# Patient Record
Sex: Female | Born: 1990 | Race: White | Hispanic: No | Marital: Single | Smoking: Never smoker
Health system: Southern US, Community
[De-identification: ages and names within clinical notes are randomized; demographics above are authoritative.]

## PROBLEM LIST (undated history)

## (undated) DIAGNOSIS — L309 Dermatitis, unspecified: Secondary | ICD-10-CM

## (undated) DIAGNOSIS — L509 Urticaria, unspecified: Secondary | ICD-10-CM

## (undated) DIAGNOSIS — T783XXA Angioneurotic edema, initial encounter: Secondary | ICD-10-CM

## (undated) DIAGNOSIS — J4520 Mild intermittent asthma, uncomplicated: Secondary | ICD-10-CM

## (undated) HISTORY — DX: Dermatitis, unspecified: L30.9

## (undated) HISTORY — DX: Mild intermittent asthma, uncomplicated: J45.20

## (undated) HISTORY — PX: ADENOIDECTOMY: SUR15

## (undated) HISTORY — PX: TYMPANOSTOMY TUBE PLACEMENT: SHX32

## (undated) HISTORY — DX: Urticaria, unspecified: L50.9

## (undated) HISTORY — DX: Angioneurotic edema, initial encounter: T78.3XXA

---
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
## 2015-09-02 ENCOUNTER — Ambulatory Visit (HOSPITAL_COMMUNITY)
Admission: EM | Admit: 2015-09-02 | Discharge: 2015-09-02 | Disposition: A | Discharge status: Home / Self Care | Payer: BLUE CROSS/BLUE SHIELD | Attending: Family Medicine | Admitting: Family Medicine

## 2015-09-02 ENCOUNTER — Encounter (HOSPITAL_COMMUNITY): Payer: Self-pay | Admitting: Family Medicine

## 2015-09-02 DIAGNOSIS — R109 Unspecified abdominal pain: Secondary | ICD-10-CM | POA: Diagnosis not present

## 2015-09-02 DIAGNOSIS — R319 Hematuria, unspecified: Secondary | ICD-10-CM | POA: Diagnosis not present

## 2015-09-02 DIAGNOSIS — Z882 Allergy status to sulfonamides status: Secondary | ICD-10-CM | POA: Diagnosis not present

## 2015-09-02 LAB — POCT PREGNANCY, URINE: PREG TEST UR: NEGATIVE

## 2015-09-02 LAB — POCT URINALYSIS DIP (DEVICE)
Bilirubin Urine: NEGATIVE
Glucose, UA: NEGATIVE mg/dL
KETONES UR: NEGATIVE mg/dL
Nitrite: NEGATIVE
PH: 6.5 (ref 5.0–8.0)
PROTEIN: NEGATIVE mg/dL
Specific Gravity, Urine: 1.02 (ref 1.005–1.030)
UROBILINOGEN UA: 0.2 mg/dL (ref 0.0–1.0)

## 2015-09-02 MED ORDER — CIPROFLOXACIN HCL 500 MG PO TABS: 500.0000 mg | ORAL_TABLET | Freq: Two times a day (BID) | ORAL | 0 refills | Status: DC

## 2015-09-02 NOTE — ED Triage Notes (Signed)
Pt here for hematuria that started today. sts some pain with urination.

## 2015-09-02 NOTE — ED Provider Notes (Signed)
[facility]    CSN: [id number] Arrival date & time: 09/02/15  1950  First Provider Contact:  First MD Initiated Contact with Patient 09/02/15 2008        History   Chief Complaint Chief Complaint  Patient presents with  . Hematuria    HPI Joy NoelKatherine Wade is a 25 y.o. female.   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 She's had kidney stones 2 over the last 8 years. She is not a nausea or vomiting.  She describes intermittent blood on tissue when she wipes after urinating. Her period is due now. She has some lower abdominal cramping consistent with her menses..  Patient is quite anxious stating that she just had a panic attack because she has had some any losses in her family from various causes.      History reviewed. No pertinent past medical history.  There are no active problems to display for this patient.   History reviewed. No pertinent surgical history.  OB History    No data available       Home Medications    Prior to Admission medications   Medication Sig Start Date End Date Taking? Authorizing Provider  ciprofloxacin (CIPRO) 500 MG tablet Take 1 tablet (500 mg total) by mouth 2 (two) times daily. 09/02/15   Elvina SidleKurt Lona Six, MD    Family History History reviewed. No pertinent family history.  Social History Social History  Substance Use Topics  . Smoking status: Never Smoker  . Smokeless tobacco: Never Used  . Alcohol use Not on file     Allergies   Other and Sulfa antibiotics   Review of Systems Review of Systems  Constitutional: Negative.   HENT: Negative.   Eyes: Negative.   Respiratory: Negative.   Cardiovascular: Negative.   Gastrointestinal: Negative.   Genitourinary: Positive for flank pain and hematuria. Negative for vaginal discharge and vaginal pain.  Neurological: Negative.   Hematological: Negative.      Physical Exam Triage Vital  Signs ED Triage Vitals  Enc Vitals Group     BP      Pulse      Resp      Temp      Temp src      SpO2      Weight      Height      Head Circumference      Peak Flow      Pain Score      Pain Loc      Pain Edu?      Excl. in GC?    No data found.   Updated Vital Signs BP 142/91   Pulse 115   Temp 99.1 F (37.3 C)   Resp 18   LMP 08/10/2015   SpO2 98%     Physical Exam  Constitutional: She is oriented to person, place, and time. She appears well-developed and well-nourished.  HENT:  Head: Normocephalic and atraumatic.  Eyes: Conjunctivae are normal. Pupils are equal, round, and reactive to light.  Neck: Neck supple.  Abdominal: Soft. Bowel sounds are normal.  Minimal left flank tenderness  Musculoskeletal: Normal range of motion.  Neurological: She is alert and oriented to person, place, and time.  Skin: Skin is warm and dry.  Psychiatric:  Patient is anxious but appropriate  Nursing note and vitals reviewed.    UC Treatments / Results  Labs (all labs ordered are listed,  but only abnormal results are displayed) Labs Reviewed  POCT URINALYSIS DIP (DEVICE) - Abnormal; Notable for the following:       Result Value   Hgb urine dipstick MODERATE (*)    Leukocytes, UA TRACE (*)    All other components within normal limits  URINE CULTURE  POCT PREGNANCY, URINE    EKG  EKG Interpretation None       Radiology No results found.  Procedures Procedures (including critical care time)  Medications Ordered in UC Medications - No data to display   Initial Impression / Assessment and Plan / UC Course  I have reviewed the triage vital signs and the nursing notes.  Pertinent labs & imaging results that were available during my care of the patient were reviewed by me and considered in my medical decision making (see chart for details).  Clinical Course    Final Clinical Impressions(s) / UC Diagnoses   Final diagnoses:  Hematuria  Left flank pain     New Prescriptions New Prescriptions   CIPROFLOXACIN (CIPRO) 500 MG TABLET    Take 1 tablet (500 mg total) by mouth 2 (two) times daily.     Elvina SidleKurt Analayah Brooke, MD 09/02/15 2047

## 2015-09-02 NOTE — Discharge Instructions (Signed)
Urine tests is suggestive of urinary tract infection. You may still have a kidney stone in conjunction with this infection. Therefore we are starting you on antibiotics and ask you to increase your consumption of water.  If your symptoms worsen or do not resolve over the next 48 hours, please return or go to the emergency room

## 2015-09-04 LAB — URINE CULTURE

## 2015-09-10 ENCOUNTER — Telehealth (HOSPITAL_COMMUNITY): Payer: Self-pay | Admitting: Emergency Medicine

## 2015-09-10 NOTE — Telephone Encounter (Signed)
-----   Message from Joy MooreLaura W Murray, MD sent at 09/05/2015  9:52 AM EDT ----- Please let patient know that urine culture does not clearly demonstrate a UTI.  Recheck for further evaluation if symptoms persist.  LM

## 2015-09-10 NOTE — Telephone Encounter (Signed)
Called pt and notified of recent lab results from visit 8/17 Pt ID'd properly... Reports feeling better and sx have subsided and has finished antibiotics.  Adv pt if sx are not getting better to return or to f/u w/PCP Pt verb understanding.

## 2016-11-16 ENCOUNTER — Ambulatory Visit (INDEPENDENT_AMBULATORY_CARE_PROVIDER_SITE_OTHER): Payer: BLUE CROSS/BLUE SHIELD | Admitting: Allergy and Immunology

## 2016-11-16 ENCOUNTER — Encounter: Payer: Self-pay | Admitting: Allergy and Immunology

## 2016-11-16 VITALS — BP 122/90 | HR 92 | Temp 98.1°F | Resp 16 | Ht 62.32 in | Wt 178.8 lb

## 2016-11-16 DIAGNOSIS — J4520 Mild intermittent asthma, uncomplicated: Secondary | ICD-10-CM | POA: Insufficient documentation

## 2016-11-16 DIAGNOSIS — H1013 Acute atopic conjunctivitis, bilateral: Secondary | ICD-10-CM

## 2016-11-16 DIAGNOSIS — T781XXA Other adverse food reactions, not elsewhere classified, initial encounter: Secondary | ICD-10-CM | POA: Diagnosis not present

## 2016-11-16 DIAGNOSIS — T7800XA Anaphylactic reaction due to unspecified food, initial encounter: Secondary | ICD-10-CM | POA: Insufficient documentation

## 2016-11-16 DIAGNOSIS — H1010 Acute atopic conjunctivitis, unspecified eye: Secondary | ICD-10-CM | POA: Insufficient documentation

## 2016-11-16 DIAGNOSIS — J3089 Other allergic rhinitis: Secondary | ICD-10-CM | POA: Diagnosis not present

## 2016-11-16 HISTORY — DX: Mild intermittent asthma, uncomplicated: J45.20

## 2016-11-16 MED ORDER — AZELASTINE-FLUTICASONE 137-50 MCG/ACT NA SUSP: 1.0000 | Freq: Two times a day (BID) | NASAL | 5 refills | Status: DC

## 2016-11-16 MED ORDER — ALBUTEROL SULFATE HFA 108 (90 BASE) MCG/ACT IN AERS: 2.0000 | INHALATION_SPRAY | Freq: Four times a day (QID) | RESPIRATORY_TRACT | 2 refills | Status: DC | PRN

## 2016-11-16 MED ORDER — EPINEPHRINE 0.3 MG/0.3ML IJ SOAJ: INTRAMUSCULAR | 3 refills | Status: DC

## 2016-11-16 MED ORDER — LEVOCETIRIZINE DIHYDROCHLORIDE 5 MG PO TABS: 5.0000 mg | ORAL_TABLET | Freq: Every day | ORAL | 5 refills | Status: DC | PRN

## 2016-11-16 MED ORDER — OLOPATADINE HCL 0.7 % OP SOLN: 1.0000 [drp] | Freq: Every day | OPHTHALMIC | 5 refills | Status: DC | PRN

## 2016-11-16 NOTE — Assessment & Plan Note (Signed)
   Continue to have access to albuterol HFA, 1-2 inhalations every 4-6 hours if needed.  A refill prescription has been provided.  Subjective and objective measures of pulmonary function will be followed and the treatment plan will be adjusted accordingly.

## 2016-11-16 NOTE — Assessment & Plan Note (Signed)
   Treatment plan as outlined above for allergic rhinitis.  A prescription has been provided for Pazeo, one drop per eye daily as needed.  I have also recommended eye lubricant drops (i.e., Natural Tears) as needed. 

## 2016-11-16 NOTE — Patient Instructions (Addendum)
Food allergy The patient's history suggests food allergy and positive skin test results today supports this diagnosis.  Her history also suggests oral allergy syndrome.  Meticulous avoidance of celery, carrot, and cabbage as discussed.  In addition, she will avoid any fresh/raw fruits or vegetables which cause oropharyngeal pruritus or ear canal pruritus.  A prescription has been provided for epinephrine auto-injector 2 pack along with instructions for proper administration.  A food allergy action plan has been provided and discussed.  Medic Alert identification is recommended.  Information regarding oral allergy syndrome has been discussed and provided in written form.  Perennial and seasonal allergic rhinitis  Aeroallergen avoidance measures have been discussed and provided in written form.  A prescription has been provided for levocetirizine, 5 mg daily as needed.  A prescription has been provided for Dymista (azelastine/fluticasone) nasal spray, 1 spray per nostril twice daily as needed. Proper nasal spray technique has been discussed and demonstrated.  I have also recommended nasal saline spray (i.e., Simply Saline) or nasal saline lavage (i.e., NeilMed) as needed and prior to medicated nasal sprays.  The risks and benefits of aeroallergen immunotherapy have been discussed. The patient is motivated to initiate immunotherapy if insurance coverage is favorable. She will let us know how he/she would like to proceed.  Allergic conjunctivitis  Treatment plan as outlined above for allergic rhinitis.  A prescription has been provided for Pazeo, one drop per eye daily as needed.  I have also recommended eye lubricant drops (i.e., Natural Tears) as needed.  Mild intermittent asthma  Continue to have access to albuterol HFA, 1-2 inhalations every 4-6 hours if needed.  A refill prescription has been provided.  Subjective and objective measures of pulmonary function will be followed and  the treatment plan will be adjusted accordingly.   Return in about 3 months (around 02/16/2017), or if symptoms worsen or fail to improve.  Reducing Pollen Exposure  The American Academy of Allergy, Asthma and Immunology suggests the following steps to reduce your exposure to pollen during allergy seasons.    1. Do not hang sheets or clothing out to dry; pollen may collect on these items. 2. Do not mow lawns or spend time around freshly cut grass; mowing stirs up pollen. 3. Keep windows closed at night.  Keep car windows closed while driving. 4. Minimize morning activities outdoors, a time when pollen counts are usually at their highest. 5. Stay indoors as much as possible when pollen counts or humidity is high and on windy days when pollen tends to remain in the air longer. 6. Use air conditioning when possible.  Many air conditioners have filters that trap the pollen spores. 7. Use a HEPA room air filter to remove pollen form the indoor air you breathe.   Control of House Dust Mite Allergen  House dust mites play a major role in allergic asthma and rhinitis.  They occur in environments with high humidity wherever human skin, the food for dust mites is found. High levels have been detected in dust obtained from mattresses, pillows, carpets, upholstered furniture, bed covers, clothes and soft toys.  The principal allergen of the house dust mite is found in its feces.  A gram of dust may contain 1,000 mites and 250,000 fecal particles.  Mite antigen is easily measured in the air during house cleaning activities.    1. Encase mattresses, including the box spring, and pillow, in an air tight cover.  Seal the zipper end of the encased mattresses with wide adhesive  tape. 2. Wash the bedding in water of 130 degrees Farenheit weekly.  Avoid cotton comforters/quilts and flannel bedding: the most ideal bed covering is the dacron comforter. 3. Remove all upholstered furniture from the  bedroom. 4. Remove carpets, carpet padding, rugs, and non-washable window drapes from the bedroom.  Wash drapes weekly or use plastic window coverings. 5. Remove all non-washable stuffed toys from the bedroom.  Wash stuffed toys weekly. 6. Have the room cleaned frequently with a vacuum cleaner and a damp dust-mop.  The patient should not be in a room which is being cleaned and should wait 1 hour after cleaning before going into the room. 7. Close and seal all heating outlets in the bedroom.  Otherwise, the room will become filled with dust-laden air.  An electric heater can be used to heat the room. Reduce indoor humidity to less than 50%.  Do not use a humidifier.  Control of Dog or Cat Allergen  Avoidance is the best way to manage a dog or cat allergy. If you have a dog or cat and are allergic to dog or cats, consider removing the dog or cat from the home. If you have a dog or cat but don't want to find it a new home, or if your family wants a pet even though someone in the household is allergic, here are some strategies that may help keep symptoms at bay:  1. Keep the pet out of your bedroom and restrict it to only a few rooms. Be advised that keeping the dog or cat in only one room will not limit the allergens to that room. 2. Don't pet, hug or kiss the dog or cat; if you do, wash your hands with soap and water. 3. High-efficiency particulate air (HEPA) cleaners run continuously in a bedroom or living room can reduce allergen levels over time. 4. Place electrostatic material sheet in the air inlet vent in the bedroom. 5. Regular use of a high-efficiency vacuum cleaner or a central vacuum can reduce allergen levels. 6. Giving your dog or cat a bath at least once a week can reduce airborne allergen.  Control of Mold Allergen  Mold and fungi can grow on a variety of surfaces provided certain temperature and moisture conditions exist.  Outdoor molds grow on plants, decaying vegetation and soil.   The major outdoor mold, Alternaria and Cladosporium, are found in very high numbers during hot and dry conditions.  Generally, a late Summer - Fall peak is seen for common outdoor fungal spores.  Rain will temporarily lower outdoor mold spore count, but counts rise rapidly when the rainy period ends.  The most important indoor molds are Aspergillus and Penicillium.  Dark, humid and poorly ventilated basements are ideal sites for mold growth.  The next most common sites of mold growth are the bathroom and the kitchen.  Outdoor MicrosoftMold Control 1. Use air conditioning and keep windows closed 2. Avoid exposure to decaying vegetation. 3. Avoid leaf raking. 4. Avoid grain handling. 5. Consider wearing a face mask if working in moldy areas.  Indoor Mold Control 1. Maintain humidity below 50%. 2. Clean washable surfaces with 5% bleach solution. 3. Remove sources e.g. Contaminated carpets.  Control of Cockroach Allergen  Cockroach allergen has been identified as an important cause of acute attacks of asthma, especially in urban settings.  There are fifty-five species of cockroach that exist in the [place], however only three, the [place], [place] and Oriental species produce allergen that can affect patients  with Asthma.  Allergens can be obtained from fecal particles, egg casings and secretions from cockroaches.    1. Remove food sources. 2. Reduce access to water. 3. Seal access and entry points. 4. Spray runways with 0.5-1% Diazinon or Chlorpyrifos 5. Blow boric acid power under stoves and refrigerator. 6. Place bait stations (hydramethylnon) at feeding sites.   Oral Allergy Syndrome (OAS)  Oral Allergy Syndrome or OAS is an allergic reaction to certain (usually fresh) fruits, nuts, and vegetables. The allergy is not actually an allergy to food but a syndrome that develops in pollen allergy sufferers. The immune system mistakes the food proteins for the pollen proteins and causes an allergic  reaction. For instance, an allergy to ragweed is associated with OAS reactions to banana, watermelon, cantaloupe, honeydew, zucchini, and cucumber. This does not mean that all sufferers of an allergy to ragweed will experience adverse effects from all or even any of these foods. Reactions may begin with one type of food and with reactions to others developing later. However, reaction to one or more foods in any given category does not necessarily mean a person is allergic to all foods in that group. OAS sufferers may have a number of reactions that usually occur very rapidly, within minutes of eating a trigger food. The most common reaction is an itching or burning sensation in the lips, mouth, and/or pharynx. Sometimes other reactions can be triggered in the eyes, nose, and skin. The most severe reactions can result in asthma problems or anaphylaxis.  If a sufferer is able to swallow the food, there is a good chance that there will be a reaction later in the gastrointestinal tract. Vomiting, diarrhea, severe indigestion, or cramps may occur.  Treatment: An OAS sufferer should avoid foods to which they are allergic. Peeling or cooking the food has shown to reduce symptoms in the throat and mouth, but may not relieve symptoms in the gastrointestinal tract. Antihistamines may also relieve the symptoms of the allergy. Persons with severe reactions may consider carrying injectable epinephrine should systemic symptoms occur. Allergy immunotherapy to the pollens has improved or cured OAS in many patients, though this has not been consistent for all patients. Laban Emperor pollen: almonds, apples, celery, cherries, hazel nuts, peaches, pears, parsley, strawberry, raspberry . Birch pollen: almonds, apples, apricots, avocados, bananas, carrots, celery, cherries, chicory, coriander, fennel, fig, hazel nuts, kiwifruit, nectarines, parsley, parsnips, peaches, pears, peppers, plums, potatoes, prunes, soy, strawberries, wheat;  Potential: walnuts . Grass pollen: fig, melons, tomatoes, oranges . Mugwort pollen : carrots, celery, coriander, fennel, parsley, peppers, sunflower . Ragweed pollen : banana, cantaloupe, cucumber, green pepper, paprika, sunflower seeds/oil, honeydew, watermelon, zucchini, echinacea, artichoke, dandelions, honey (if bees pollinate from wild flowers), hibiscus or chamomile tea . Possible cross-reactions (to any of the above): berries (strawberries, blueberries, raspberries, etc), citrus (oranges, lemons, etc), grapes, mango, figs, peanut, pineapple, pomegranates, watermelon

## 2016-11-16 NOTE — Assessment & Plan Note (Signed)
   Aeroallergen avoidance measures have been discussed and provided in written form.  A prescription has been provided for levocetirizine, 5 mg daily as needed.  A prescription has been provided for Dymista (azelastine/fluticasone) nasal spray, 1 spray per nostril twice daily as needed. Proper nasal spray technique has been discussed and demonstrated.  I have also recommended nasal saline spray (i.e., Simply Saline) or nasal saline lavage (i.e., NeilMed) as needed and prior to medicated nasal sprays.  The risks and benefits of aeroallergen immunotherapy have been discussed. The patient is motivated to initiate immunotherapy if insurance coverage is favorable. She will let us know how he/she would like to proceed.

## 2016-11-16 NOTE — Assessment & Plan Note (Signed)
The patient's history suggests food allergy and positive skin test results today supports this diagnosis.  Her history also suggests oral allergy syndrome.  Meticulous avoidance of celery, carrot, and cabbage as discussed.  In addition, she will avoid any fresh/raw fruits or vegetables which cause oropharyngeal pruritus or ear canal pruritus.  A prescription has been provided for epinephrine auto-injector 2 pack along with instructions for proper administration.  A food allergy action plan has been provided and discussed.  Medic Alert identification is recommended.  Information regarding oral allergy syndrome has been discussed and provided in written form.

## 2016-11-16 NOTE — Progress Notes (Signed)
New Patient Note  RE: Joy Wade MRN: [id number] DOB: 06/20/1990 Date of Office Visit: 11/16/2016  Referring provider: No ref. provider found Primary care provider: Patient, No Pcp Per  Chief Complaint: Allergic Rhinitis ; Bronchitis; and Food Intolerance   History of present illness: Joy NoelKatherine Cragg is a 26 y.o. female presenting today for evaluation of possible food allergies as well as rhinitis.  She reports that since 2014 she has experienced oral pruritus, pharyngeal pruritus, and ear canal pruritus with the consumption of certain raw, fresh fruits and vegetables, including strawberry, weekly, peaches, plums, and carrots.  She is able to tolerate these foods if they have been cooked or processed.  On one occasion, however she consumed vegetable soup, which had been cooked, and aperients tongue swelling, lip swelling, and wheezing. She states that she has "really, really bad pollen and ragweed allergies."  She experiences frequent nasal congestion, rhinorrhea, sneezing, postnasal drainage, nasal pruritus and ocular pruritus, and sinus pressure.  These symptoms occur year round but are more frequent and severe in the spring time in the fall.  Specific triggers include pollen exposure, cats, and raked leaves.  She attempts to control the symptoms with nasal steroid, guaifenesin, and/or DayQuil or NyQuil. Over the past 3 years she has experienced episodes of chest tightness, wheezing, and coughing.  The symptoms occur more frequently in the spring, in the fall, as well as with cold/dry air.  She was prescribed pro-air HFA approximately 2 or 3 years ago and has received rapid symptom relief with this medication.   Assessment and plan: Food allergy The patient's history suggests food allergy and positive skin test results today supports this diagnosis.  Her history also suggests oral allergy syndrome.  Meticulous avoidance of celery, carrot, and cabbage as discussed.  In  addition, she will avoid any fresh/raw fruits or vegetables which cause oropharyngeal pruritus or ear canal pruritus.  A prescription has been provided for epinephrine auto-injector 2 pack along with instructions for proper administration.  A food allergy action plan has been provided and discussed.  Medic Alert identification is recommended.  Information regarding oral allergy syndrome has been discussed and provided in written form.  Perennial and seasonal allergic rhinitis  Aeroallergen avoidance measures have been discussed and provided in written form.  A prescription has been provided for levocetirizine, 5 mg daily as needed.  A prescription has been provided for Dymista (azelastine/fluticasone) nasal spray, 1 spray per nostril twice daily as needed. Proper nasal spray technique has been discussed and demonstrated.  I have also recommended nasal saline spray (i.e., Simply Saline) or nasal saline lavage (i.e., NeilMed) as needed and prior to medicated nasal sprays.  The risks and benefits of aeroallergen immunotherapy have been discussed. The patient is motivated to initiate immunotherapy if insurance coverage is favorable. She will let us know how he/she would like to proceed.  Allergic conjunctivitis  Treatment plan as outlined above for allergic rhinitis.  A prescription has been provided for Pazeo, one drop per eye daily as needed.  I have also recommended eye lubricant drops (i.e., Natural Tears) as needed.  Mild intermittent asthma  Continue to have access to albuterol HFA, 1-2 inhalations every 4-6 hours if needed.  A refill prescription has been provided.  Subjective and objective measures of pulmonary function will be followed and the treatment plan will be adjusted accordingly.   Meds ordered this encounter  Medications  . EPINEPHrine (AUVI-Q) 0.3 mg/0.3 mL IJ SOAJ injection    Sig: Use as  directed for severe allergic reactions    Dispense:  4 Device     Refill:  3  . levocetirizine (XYZAL) 5 MG tablet    Sig: Take 1 tablet (5 mg total) by mouth daily as needed for allergies.    Dispense:  30 tablet    Refill:  5  . Azelastine-Fluticasone (DYMISTA) 137-50 MCG/ACT SUSP    Sig: Place 1 spray into the nose 2 (two) times daily.    Dispense:  1 Bottle    Refill:  5  . Olopatadine HCl (PAZEO) 0.7 % SOLN    Sig: Apply 1 drop to eye daily as needed.    Dispense:  1 Bottle    Refill:  5  . albuterol (PROVENTIL HFA;VENTOLIN HFA) 108 (90 Base) MCG/ACT inhaler    Sig: Inhale 2 puffs into the lungs every 6 (six) hours as needed for wheezing or shortness of breath.    Dispense:  1 Inhaler    Refill:  2    Diagnostics: Spirometry:  Normal with an FEV1 of 113% predicted. This study was performed while the patient was asymptomatic.  Please see scanned spirometry results for details. Epicutaneous testing: Robust positive to grass pollen, weed pollen, ragweed pollen, tree pollen, cat hair, and dust mite antigen.  Positive to mold. Intradermal testing: Positive to molds, dog epithelium, and cockroach antigen. Food allergen skin testing: Positive to celery, carrot, and cabbage.    Physical examination: Blood pressure 122/90, pulse 92, temperature 98.1 F (36.7 C), temperature source Oral, resp. rate 16, height 5' 2.32" (1.583 m), weight 178 lb 12.7 oz (81.1 kg), SpO2 97 %.  General: Alert, interactive, in no acute distress. HEENT: TMs pearly gray, turbinates edematous and pale with clear discharge, post-pharynx moderately erythematous. Neck: Supple without lymphadenopathy. Lungs: Clear to auscultation without wheezing, rhonchi or rales. CV: Normal S1, S2 without murmurs. Abdomen: Nondistended, nontender. Skin: Warm and dry, without lesions or rashes. Extremities:  No clubbing, cyanosis or edema. Neuro:   Grossly intact.  Review of systems:  Review of systems negative except as noted in HPI / PMHx or noted below: Review of Systems    Constitutional: Negative.   HENT: Negative.   Eyes: Negative.   Respiratory: Negative.   Cardiovascular: Negative.   Gastrointestinal: Negative.   Genitourinary: Negative.   Musculoskeletal: Negative.   Skin: Negative.   Neurological: Negative.   Endo/Heme/Allergies: Negative.   Psychiatric/Behavioral: Negative.     Past medical history:  Past Medical History:  Diagnosis Date  . Eczema   . Mild intermittent asthma 11/16/2016  . Urticaria    from medication    Past surgical history:  Past Surgical History:  Procedure Laterality Date  . APPENDECTOMY  2006  . LITHOTRIPSY  2009  . TYMPANOSTOMY TUBE PLACEMENT      Family history: Family History  Problem Relation Age of Onset  . Food Allergy Father   . Bronchitis Father   . Food Allergy Sister     Social history: Social History   Social History  . Marital status: Single    Spouse name: N/A  . Number of children: N/A  . Years of education: N/A   Occupational History  . Not on file.   Social History Main Topics  . Smoking status: Never Smoker  . Smokeless tobacco: Never Used  . Alcohol use Not on file  . Drug use: Unknown  . Sexual activity: Not on file   Other Topics Concern  . Not on file   Social History  Narrative  . No narrative on file   Environmental History: The patient lives in an apartment with carpeting in the bedroom and central air/heat.  She is a non-smoker without pets.  Allergies as of 11/16/2016      Reactions   Amoxicillin    Erythromycin Base Dermatitis   Other    amoxicillan   Sulfa Antibiotics    Sulfamethoxazole Dermatitis      Medication List       Accurate as of 11/16/16  7:01 PM. Always use your most recent med list.          Azelastine-Fluticasone 137-50 MCG/ACT Susp Commonly known as:  DYMISTA Place 1 spray into the nose 2 (two) times daily.   buPROPion 75 MG tablet Commonly known as:  WELLBUTRIN Take by mouth.   ciprofloxacin 500 MG tablet Commonly known  as:  CIPRO Take 1 tablet (500 mg total) by mouth 2 (two) times daily.   diphenhydrAMINE 12.5 MG/5ML elixir Commonly known as:  BENADRYL Take by mouth 4 (four) times daily as needed.   EPINEPHrine 0.3 mg/0.3 mL Soaj injection Commonly known as:  AUVI-Q Use as directed for severe allergic reactions   levocetirizine 5 MG tablet Commonly known as:  XYZAL Take 1 tablet (5 mg total) by mouth daily as needed for allergies.   Olopatadine HCl 0.7 % Soln Commonly known as:  PAZEO Apply 1 drop to eye daily as needed.   PROAIR HFA 108 (90 Base) MCG/ACT inhaler Generic drug:  albuterol Inhale into the lungs every 6 (six) hours as needed for wheezing or shortness of breath.   albuterol 108 (90 Base) MCG/ACT inhaler Commonly known as:  PROVENTIL HFA;VENTOLIN HFA Inhale 2 puffs into the lungs every 6 (six) hours as needed for wheezing or shortness of breath.   sertraline 100 MG tablet Commonly known as:  ZOLOFT TK 1 AND 1/2 TS PO QD       Known medication allergies: Allergies  Allergen Reactions  . Amoxicillin   . Erythromycin Base Dermatitis  . Other     amoxicillan  . Sulfa Antibiotics   . Sulfamethoxazole Dermatitis    I appreciate the opportunity to take part in Cecylia's care. Please do not hesitate to contact me with questions.  Sincerely,   R. Jorene Guest, MD

## 2016-11-20 ENCOUNTER — Encounter: Payer: BLUE CROSS/BLUE SHIELD | Attending: Obstetrics | Admitting: Registered"

## 2016-11-20 ENCOUNTER — Encounter: Payer: Self-pay | Admitting: Registered"

## 2016-11-20 DIAGNOSIS — Z713 Dietary counseling and surveillance: Secondary | ICD-10-CM | POA: Diagnosis not present

## 2016-11-20 DIAGNOSIS — E669 Obesity, unspecified: Secondary | ICD-10-CM | POA: Insufficient documentation

## 2016-11-20 NOTE — Patient Instructions (Signed)
Aim to get 8 hours of sleep each night and keep a regular sleep schedule. Aim to get a balanced breakfast with both carb & protein and aim to have less coke and energy drink. Keep food in your bag for lunch when you don't have time to get a regular meal.

## 2016-11-20 NOTE — Progress Notes (Signed)
Medical Nutrition Therapy:  Appt start time: 0930 end time:  1030.  Assessment:  Primary concerns today: Pt states she has gained about 50 lbs in the last few years. Pt states her dad and sister also noticed they gained weight starting 61~25 yrs old.   Pt states she has OAS to most raw fruits and vegetables and has had extreme reactions to celery and carrots. Pt reports she is going to start immunotherapy next week.  Preferred Learning Style:   No preference indicated   Learning Readiness:   Ready  MEDICATIONS: reviewed   DIETARY INTAKE:  Usual eating pattern includes 2 meals and 2 snacks per day.  Avoided foods include raw fruits and vegetables.  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 Beverages: water, coke, energy drink  Usual physical activity: ADL  Estimated energy needs: 2000 calories 225 g carbohydrates 125 g protein 67 g fat  Progress Towards Goal(s):  In progress.   Nutritional Diagnosis:  NI-5.8.4 Inconsistent carbohydrate intake As related to skipped meals, most carbs in evening.  As evidenced by dietary recall.    Intervention:  Nutrition Education. Discussed balanced eating. Discussed importance of sleep on health.   Teaching Method Utilized:  Hands on  Handouts given during visit include:  My Plate Planner  Sleep Hygiene Tips  Barriers to learning/adherence to lifestyle change: food allergies  Demonstrated degree of understanding via:  Teach Back   Monitoring/Evaluation:  Dietary intake, exercise, and body weight prn.

## 2016-11-23 NOTE — Progress Notes (Signed)
VIALS EXP 11-24-17 

## 2016-11-29 NOTE — Progress Notes (Signed)
Reprint labels 

## 2016-11-30 DIAGNOSIS — J301 Allergic rhinitis due to pollen: Secondary | ICD-10-CM | POA: Diagnosis not present

## 2016-12-01 DIAGNOSIS — J3089 Other allergic rhinitis: Secondary | ICD-10-CM | POA: Diagnosis not present

## 2016-12-11 ENCOUNTER — Ambulatory Visit (INDEPENDENT_AMBULATORY_CARE_PROVIDER_SITE_OTHER): Payer: BLUE CROSS/BLUE SHIELD

## 2016-12-11 DIAGNOSIS — J309 Allergic rhinitis, unspecified: Secondary | ICD-10-CM

## 2016-12-11 NOTE — Progress Notes (Signed)
Immunotherapy   Patient Details  Name: Joy Wade MRN: [id number] Date of Birth: [birth year]  12/11/2016  Joy Wade started injections for  mold,cat,dog,cr,grass,weeds,tree,d-mite Following schedule: A  Frequency:1 time per week Epi-Pen:Epi-Pen Available  Consent signed and patient instructions given.   Riley LamCatina Halynn Reitano 12/11/2016, 10:43 AM

## 2016-12-18 ENCOUNTER — Encounter: Payer: Self-pay | Admitting: *Deleted

## 2016-12-20 ENCOUNTER — Ambulatory Visit (INDEPENDENT_AMBULATORY_CARE_PROVIDER_SITE_OTHER): Payer: BLUE CROSS/BLUE SHIELD

## 2016-12-20 DIAGNOSIS — J309 Allergic rhinitis, unspecified: Secondary | ICD-10-CM | POA: Diagnosis not present

## 2016-12-28 ENCOUNTER — Ambulatory Visit (INDEPENDENT_AMBULATORY_CARE_PROVIDER_SITE_OTHER): Payer: BLUE CROSS/BLUE SHIELD | Admitting: *Deleted

## 2016-12-28 DIAGNOSIS — J309 Allergic rhinitis, unspecified: Secondary | ICD-10-CM

## 2017-01-17 ENCOUNTER — Ambulatory Visit (INDEPENDENT_AMBULATORY_CARE_PROVIDER_SITE_OTHER): Payer: BLUE CROSS/BLUE SHIELD | Admitting: *Deleted

## 2017-01-17 DIAGNOSIS — J309 Allergic rhinitis, unspecified: Secondary | ICD-10-CM | POA: Diagnosis not present

## 2017-01-26 ENCOUNTER — Other Ambulatory Visit: Payer: Self-pay

## 2017-01-26 ENCOUNTER — Ambulatory Visit (INDEPENDENT_AMBULATORY_CARE_PROVIDER_SITE_OTHER): Payer: BLUE CROSS/BLUE SHIELD

## 2017-01-26 DIAGNOSIS — J309 Allergic rhinitis, unspecified: Secondary | ICD-10-CM | POA: Diagnosis not present

## 2017-01-26 MED ORDER — ALBUTEROL SULFATE HFA 108 (90 BASE) MCG/ACT IN AERS: 2.0000 | INHALATION_SPRAY | Freq: Four times a day (QID) | RESPIRATORY_TRACT | 1 refills | Status: DC | PRN

## 2017-01-26 NOTE — Telephone Encounter (Signed)
Patient came in to get her allergy injections and informed us that her Proair was not called into pharmacy during her last visit. It looks like it was sent in [facility] Will resend.

## 2017-02-07 ENCOUNTER — Ambulatory Visit (INDEPENDENT_AMBULATORY_CARE_PROVIDER_SITE_OTHER): Payer: BLUE CROSS/BLUE SHIELD

## 2017-02-07 DIAGNOSIS — J309 Allergic rhinitis, unspecified: Secondary | ICD-10-CM | POA: Diagnosis not present

## 2017-02-20 ENCOUNTER — Ambulatory Visit (INDEPENDENT_AMBULATORY_CARE_PROVIDER_SITE_OTHER): Payer: BLUE CROSS/BLUE SHIELD | Admitting: *Deleted

## 2017-02-20 DIAGNOSIS — J309 Allergic rhinitis, unspecified: Secondary | ICD-10-CM | POA: Diagnosis not present

## 2017-03-02 ENCOUNTER — Ambulatory Visit (INDEPENDENT_AMBULATORY_CARE_PROVIDER_SITE_OTHER): Payer: BLUE CROSS/BLUE SHIELD | Admitting: *Deleted

## 2017-03-02 DIAGNOSIS — J309 Allergic rhinitis, unspecified: Secondary | ICD-10-CM | POA: Diagnosis not present

## 2017-04-27 ENCOUNTER — Ambulatory Visit (INDEPENDENT_AMBULATORY_CARE_PROVIDER_SITE_OTHER): Payer: BLUE CROSS/BLUE SHIELD | Admitting: *Deleted

## 2017-04-27 DIAGNOSIS — J309 Allergic rhinitis, unspecified: Secondary | ICD-10-CM | POA: Diagnosis not present

## 2018-01-25 ENCOUNTER — Telehealth: Payer: Self-pay | Admitting: *Deleted

## 2018-01-25 NOTE — Telephone Encounter (Signed)
Patient called stating that she would like to restart her allergy injections. Her last injection was 04/27/2017 out of her silver vials at .05 dose. Please advise.

## 2018-01-28 NOTE — Telephone Encounter (Signed)
Ok, may restart in Silver vial at [facility]. Schedule A.  Thanks.

## 2018-01-28 NOTE — Telephone Encounter (Signed)
Spoke to patient advised she needs office visit to consult with Dr Nunzio Cobbs before we restart allergy shots. Patient verbalized understanding and call us back to schedule appt.. (consulted with Dr Nunzio Cobbs prior to telling patient this).

## 2018-01-28 NOTE — Telephone Encounter (Signed)
Please advise patient ov will be needed she has not been seen since 11/16/16

## 2018-02-05 NOTE — Telephone Encounter (Signed)
Called patient and left a voicemail asking to return call in order to schedule an OV with Dr. Nunzio Cobbs to restart allergy injections.

## 2018-03-18 ENCOUNTER — Telehealth: Payer: Self-pay | Admitting: *Deleted

## 2018-03-18 ENCOUNTER — Other Ambulatory Visit: Payer: Self-pay | Admitting: *Deleted

## 2018-03-18 ENCOUNTER — Encounter: Payer: Self-pay | Admitting: Allergy and Immunology

## 2018-03-18 ENCOUNTER — Ambulatory Visit (INDEPENDENT_AMBULATORY_CARE_PROVIDER_SITE_OTHER): Payer: 59 | Admitting: Allergy and Immunology

## 2018-03-18 VITALS — BP 130/78 | HR 84 | Temp 98.2°F | Resp 18 | Ht 62.5 in | Wt 167.4 lb

## 2018-03-18 DIAGNOSIS — L309 Dermatitis, unspecified: Secondary | ICD-10-CM | POA: Insufficient documentation

## 2018-03-18 DIAGNOSIS — J3089 Other allergic rhinitis: Secondary | ICD-10-CM

## 2018-03-18 DIAGNOSIS — T7800XD Anaphylactic reaction due to unspecified food, subsequent encounter: Secondary | ICD-10-CM

## 2018-03-18 DIAGNOSIS — H1013 Acute atopic conjunctivitis, bilateral: Secondary | ICD-10-CM

## 2018-03-18 DIAGNOSIS — J4520 Mild intermittent asthma, uncomplicated: Secondary | ICD-10-CM | POA: Diagnosis not present

## 2018-03-18 DIAGNOSIS — T781XXD Other adverse food reactions, not elsewhere classified, subsequent encounter: Secondary | ICD-10-CM

## 2018-03-18 MED ORDER — OLOPATADINE HCL 0.7 % OP SOLN: 1.0000 [drp] | Freq: Every day | OPHTHALMIC | 5 refills | Status: DC | PRN

## 2018-03-18 MED ORDER — LEVOCETIRIZINE DIHYDROCHLORIDE 5 MG PO TABS: 5.0000 mg | ORAL_TABLET | Freq: Every day | ORAL | 5 refills | Status: DC | PRN

## 2018-03-18 MED ORDER — EPINEPHRINE 0.3 MG/0.3ML IJ SOAJ: 0.3000 mg | Freq: Once | INTRAMUSCULAR | 2 refills | Status: AC

## 2018-03-18 MED ORDER — AZELASTINE-FLUTICASONE 137-50 MCG/ACT NA SUSP: 1.0000 | Freq: Two times a day (BID) | NASAL | 5 refills | Status: DC | PRN

## 2018-03-18 NOTE — Telephone Encounter (Signed)
Insurance no longer covers Dymista as well. Please advise if Astelin and Flonase should be sent in instead.

## 2018-03-18 NOTE — Assessment & Plan Note (Signed)
Stable.  Continue to have access to albuterol HFA, 1-2 inhalations every 4-6 hours if needed.    Subjective and objective measures of pulmonary function will be followed and the treatment plan will be adjusted accordingly.

## 2018-03-18 NOTE — Assessment & Plan Note (Signed)
   Continue avoidance of celery, carrot, and cabbage.  In addition, she will avoid any fresh/raw fruits or vegetables which cause oropharyngeal pruritus or ear canal pruritus.  A refill prescription has been provided for epinephrine auto-injector 2 pack along with instructions for proper administration.  Medic Alert identification is recommended.  Information regarding oral allergy syndrome has been discussed and provided in written form.

## 2018-03-18 NOTE — Patient Instructions (Signed)
Perennial and seasonal allergic rhinitis  Aeroallergen avoidance measures have been provided.  A refill prescription has been provided for levocetirizine, 5 mg daily as needed.  A refill prescription has been provided for Dymista (azelastine/fluticasone) nasal spray, 1 spray per nostril twice daily as needed.   Nasal saline spray (i.e., Simply Saline) or nasal saline lavage (i.e., NeilMed) is recommended as needed and prior to medicated nasal sprays.  The risks and benefits of aeroallergen immunotherapy have been discussed. The patient is interested in the possibility of initiating immunotherapy if insurance coverage is favorable. She will let us know how she would like to proceed.  Allergic conjunctivitis  Treatment plan as outlined above for allergic rhinitis.  A refill prescription has been provided for Pazeo, one drop per eye daily as needed.  I have also recommended eye lubricant drops (i.e., Natural Tears) as needed.  Food allergy  Continue avoidance of celery, carrot, and cabbage.  In addition, she will avoid any fresh/raw fruits or vegetables which cause oropharyngeal pruritus or ear canal pruritus.  A refill prescription has been provided for epinephrine auto-injector 2 pack along with instructions for proper administration.  Medic Alert identification is recommended.  Information regarding oral allergy syndrome has been discussed and provided in written form.  Mild intermittent asthma Stable.  Continue to have access to albuterol HFA, 1-2 inhalations every 4-6 hours if needed.    Subjective and objective measures of pulmonary function will be followed and the treatment plan will be adjusted accordingly.  Hand dermatitis Atopic dermatitis versus allergic contact dermatitis.  Samples of been provided for Eucrisa (crisaborole) 2% ointment twice a day to affected areas as needed.  She will start using soap that she brings from home rather than the soap provided by the  daycare.  If this problem persists or progresses we will proceed to patch testing for further evaluation.   Return for Imunotherapy injections. Return for office visit follow-up in 5-6 months or sooner if needed.  Control of Dust Mite Allergen  House dust mites play a major role in allergic asthma and rhinitis.  They occur in environments with high humidity wherever human skin, the food for dust mites is found. High levels have been detected in dust obtained from mattresses, pillows, carpets, upholstered furniture, bed covers, clothes and soft toys.  The principal allergen of the house dust mite is found in its feces.  A gram of dust may contain 1,000 mites and 250,000 fecal particles.  Mite antigen is easily measured in the air during house cleaning activities.    1. Encase mattresses, including the box spring, and pillow, in an air tight cover.  Seal the zipper end of the encased mattresses with wide adhesive tape. 2. Wash the bedding in water of 130 degrees Farenheit weekly.  Avoid cotton comforters/quilts and flannel bedding: the most ideal bed covering is the dacron comforter. 3. Remove all upholstered furniture from the bedroom. 4. Remove carpets, carpet padding, rugs, and non-washable window drapes from the bedroom.  Wash drapes weekly or use plastic window coverings. 5. Remove all non-washable stuffed toys from the bedroom.  Wash stuffed toys weekly. 6. Have the room cleaned frequently with a vacuum cleaner and a damp dust-mop.  The patient should not be in a room which is being cleaned and should wait 1 hour after cleaning before going into the room. 7. Close and seal all heating outlets in the bedroom.  Otherwise, the room will become filled with dust-laden air.  An Lexicographer can  be used to heat the room. Reduce indoor humidity to less than 50%.  Do not use a humidifier.   Reducing Pollen Exposure  The American Academy of Allergy, Asthma and Immunology suggests the following  steps to reduce your exposure to pollen during allergy seasons.    1. Do not hang sheets or clothing out to dry; pollen may collect on these items. 2. Do not mow lawns or spend time around freshly cut grass; mowing stirs up pollen. 3. Keep windows closed at night.  Keep car windows closed while driving. 4. Minimize morning activities outdoors, a time when pollen counts are usually at their highest. 5. Stay indoors as much as possible when pollen counts or humidity is high and on windy days when pollen tends to remain in the air longer. 6. Use air conditioning when possible.  Many air conditioners have filters that trap the pollen spores. 7. Use a HEPA room air filter to remove pollen form the indoor air you breathe.   Control of Dog or Cat Allergen  Avoidance is the best way to manage a dog or cat allergy. If you have a dog or cat and are allergic to dog or cats, consider removing the dog or cat from the home. If you have a dog or cat but don't want to find it a new home, or if your family wants a pet even though someone in the household is allergic, here are some strategies that may help keep symptoms at bay:  1. Keep the pet out of your bedroom and restrict it to only a few rooms. Be advised that keeping the dog or cat in only one room will not limit the allergens to that room. 2. Don't pet, hug or kiss the dog or cat; if you do, wash your hands with soap and water. 3. High-efficiency particulate air (HEPA) cleaners run continuously in a bedroom or living room can reduce allergen levels over time. 4. Place electrostatic material sheet in the air inlet vent in the bedroom. 5. Regular use of a high-efficiency vacuum cleaner or a central vacuum can reduce allergen levels. 6. Giving your dog or cat a bath at least once a week can reduce airborne allergen.   Control of Mold Allergen  Mold and fungi can grow on a variety of surfaces provided certain temperature and moisture conditions exist.   Outdoor molds grow on plants, decaying vegetation and soil.  The major outdoor mold, Alternaria and Cladosporium, are found in very high numbers during hot and dry conditions.  Generally, a late Summer - Fall peak is seen for common outdoor fungal spores.  Rain will temporarily lower outdoor mold spore count, but counts rise rapidly when the rainy period ends.  The most important indoor molds are Aspergillus and Penicillium.  Dark, humid and poorly ventilated basements are ideal sites for mold growth.  The next most common sites of mold growth are the bathroom and the kitchen.  Outdoor [employer] 1. Use air conditioning and keep windows closed 2. Avoid exposure to decaying vegetation. 3. Avoid leaf raking. 4. Avoid grain handling. 5. Consider wearing a face mask if working in moldy areas.  Indoor Mold Control 1. Maintain humidity below 50%. 2. Clean washable surfaces with 5% bleach solution. 3. Remove sources e.g. Contaminated carpets.   Control of Cockroach Allergen  Cockroach allergen has been identified as an important cause of acute attacks of asthma, especially in urban settings.  There are fifty-five species of cockroach that exist in the  [place], however only three, the [place], [place] and [place] species produce allergen that can affect patients with Asthma.  Allergens can be obtained from fecal particles, egg casings and secretions from cockroaches.    1. Remove food sources. 2. Reduce access to water. 3. Seal access and entry points. 4. Spray runways with 0.5-1% Diazinon or Chlorpyrifos 5. Blow boric acid power under stoves and refrigerator. 6. Place bait stations (hydramethylnon) at feeding sites.  Oral Allergy Syndrome (OAS)  Oral Allergy Syndrome or OAS is an allergic reaction to certain (usually fresh) fruits, nuts, and vegetables. The allergy is not actually an allergy to food but a syndrome that develops in pollen allergy sufferers. The immune system mistakes the  food proteins for the pollen proteins and causes an allergic reaction. For instance, an allergy to ragweed is associated with OAS reactions to banana, watermelon, cantaloupe, honeydew, zucchini, and cucumber. This does not mean that all sufferers of an allergy to ragweed will experience adverse effects from all or even any of these foods. Reactions may begin with one type of food and with reactions to others developing later. However, reaction to one or more foods in any given category does not necessarily mean a person is allergic to all foods in that group. OAS sufferers may have a number of reactions that usually occur very rapidly, within minutes of eating a trigger food. The most common reaction is an itching or burning sensation in the lips, mouth, and/or pharynx. Sometimes other reactions can be triggered in the eyes, nose, and skin. The most severe reactions can result in asthma problems or anaphylaxis.  If a sufferer is able to swallow the food, there is a good chance that there will be a reaction later in the gastrointestinal tract. Vomiting, diarrhea, severe indigestion, or cramps may occur.  Treatment: An OAS sufferer should avoid foods to which they are allergic. Peeling or cooking the food has shown to reduce symptoms in the throat and mouth, but may not relieve symptoms in the gastrointestinal tract. Antihistamines may also relieve the symptoms of the allergy. Persons with severe reactions may consider carrying injectable epinephrine should systemic symptoms occur. Allergy immunotherapy to the pollens has improved or cured OAS in many patients, though this has not been consistent for all patients. Laban Emperor pollen: almonds, apples, celery, cherries, hazel nuts, peaches, pears, parsley, strawberry, raspberry . Birch pollen: almonds, apples, apricots, avocados, bananas, carrots, celery, cherries, chicory, coriander, fennel, fig, hazel nuts, kiwifruit, nectarines, parsley, parsnips, peaches, pears,  peppers, plums, potatoes, prunes, soy, strawberries, wheat; Potential: walnuts . Grass pollen: fig, melons, tomatoes, oranges . Mugwort pollen : carrots, celery, coriander, fennel, parsley, peppers, sunflower . Ragweed pollen : banana, cantaloupe, cucumber, green pepper, paprika, sunflower seeds/oil, honeydew, watermelon, zucchini, echinacea, artichoke, dandelions, honey (if bees pollinate from wild flowers), hibiscus or chamomile tea . Possible cross-reactions (to any of the above): berries (strawberries, blueberries, raspberries, etc), citrus (oranges, lemons, etc), grapes, mango, figs, peanut, pineapple, pomegranates, watermelon

## 2018-03-18 NOTE — Assessment & Plan Note (Signed)
   Treatment plan as outlined above for allergic rhinitis.  A refill prescription has been provided for Pazeo, one drop per eye daily as needed.  I have also recommended eye lubricant drops (i.e., Natural Tears) as needed. 

## 2018-03-18 NOTE — Telephone Encounter (Signed)
Patient was seen today to be allergy tested to make vials for allergy injections. Patient needed to get to work and took the injection packet with her. Patient will fill out paperwork and consent and bring paperwork back to the office and then make an appointment to start allergy injections. Emergency Action Plan for food allergies and immunotherapy protocol and has been mailed to the patient's home. Patient is aware.

## 2018-03-18 NOTE — Assessment & Plan Note (Signed)
Atopic dermatitis versus allergic contact dermatitis.  Samples of been provided for Eucrisa (crisaborole) 2% ointment twice a day to affected areas as needed.  She will start using soap that she brings from home rather than the soap provided by the daycare.  If this problem persists or progresses we will proceed to patch testing for further evaluation.

## 2018-03-18 NOTE — Progress Notes (Signed)
Follow-up Note  RE: Joy Wade MRN: [id number] DOB: 1990/09/03 Date of Office Visit: 03/18/2018  Primary care provider: Maudie Flakes, FNP Referring provider: No ref. provider found  History of present illness: Joy Wade is a 28 y.o. female with allergic rhinoconjunctivitis, food allergy, oral allergy syndrome, and intermittent asthma presenting today for follow-up.  She was previously seen in this clinic for her initial evaluation in November 2018.  During that visit skin testing revealed robust reactivity to grass pollen, weed pollen, ragweed pollen, and tree pollen.  In addition she had positive skin test to molds, cat hair, dog epithelia, cockroach antigen, and dust mite antigen.  The patient is interested in the possibility of starting aeroallergen immunotherapy to reduce symptoms and decrease medication requirement.  She has oral allergy syndrome with particular problems with the consumption of strawberries, celery, and pineapple.  She did have what sounds like a systemic reaction with the consumption of celery in the past.  She does her best to avoid problematic foods but needs refill for epinephrine autoinjector. Alyssha also complains of itching, redness, and mild swelling of her hands.  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  She believes that she may have a sensitivity to the soap which is used at the school. Her asthma is well controlled.  She rarely requires albuterol rescue and does not experience limitations in normal daily activities or nocturnal awakenings due to lower respiratory symptoms.  Assessment and plan: Perennial and seasonal allergic rhinitis  Aeroallergen avoidance measures have been provided.  A refill prescription has been provided for levocetirizine, 5 mg daily as needed.  A refill prescription has been provided for Dymista (azelastine/fluticasone) nasal spray, 1 spray per nostril twice  daily as needed.   Nasal saline spray (i.e., Simply Saline) or nasal saline lavage (i.e., NeilMed) is recommended as needed and prior to medicated nasal sprays.  The risks and benefits of aeroallergen immunotherapy have been discussed. The patient is interested in the possibility of initiating immunotherapy if insurance coverage is favorable. She will let us know how she would like to proceed.  Allergic conjunctivitis  Treatment plan as outlined above for allergic rhinitis.  A refill prescription has been provided for Pazeo, one drop per eye daily as needed.  I have also recommended eye lubricant drops (i.e., Natural Tears) as needed.  Food allergy  Continue avoidance of celery, carrot, and cabbage.  In addition, she will avoid any fresh/raw fruits or vegetables which cause oropharyngeal pruritus or ear canal pruritus.  A refill prescription has been provided for epinephrine auto-injector 2 pack along with instructions for proper administration.  Medic Alert identification is recommended.  Information regarding oral allergy syndrome has been discussed and provided in written form.  Mild intermittent asthma Stable.  Continue to have access to albuterol HFA, 1-2 inhalations every 4-6 hours if needed.    Subjective and objective measures of pulmonary function will be followed and the treatment plan will be adjusted accordingly.  Hand dermatitis Atopic dermatitis versus allergic contact dermatitis.  Samples of been provided for Eucrisa (crisaborole) 2% ointment twice a day to affected areas as needed.  She will start using soap that she brings from home rather than the soap provided by the daycare.  If this problem persists or progresses we will proceed to patch testing for further evaluation.   Meds ordered this encounter  Medications  . EPINEPHrine (AUVI-Q) 0.3 mg/0.3 mL IJ SOAJ injection  Sig: Inject 0.3 mLs (0.3 mg total) into the muscle once for 1 dose. As directed for  life-threatening allergic reactions    Dispense:  4 Device    Refill:  2    Please call [phone] for delivery.    Diagnostics: Spirometry:  Normal with an FEV1 of 102% predicted.  Please see scanned spirometry results for details. Environmental skin testing: Positive to epicoccum mold.  Testing in November 2018 revealed reactivity to grass pollen, weed pollen, ragweed pollen, tree pollen, molds, cat hair, dog epithelia, cockroach antigen, and dust mite antigen.  Therefore, these were not retested today.  Food allergen skin test were not retested today.    Physical examination: Blood pressure 130/78, pulse 84, temperature 98.2 F (36.8 C), temperature source Oral, resp. rate 18, height 5' 2.5" (1.588 m), weight 167 lb 6.4 oz (75.9 kg), SpO2 94 %.  General: Alert, interactive, in no acute distress. HEENT: TMs pearly gray, turbinates edematous without discharge, post-pharynx moderately erythematous. Neck: Supple without lymphadenopathy. Lungs: Clear to auscultation without wheezing, rhonchi or rales. CV: Normal S1, S2 without murmurs. Skin: Warm and dry, without lesions or rashes.  The following portions of the patient's history were reviewed and updated as appropriate: allergies, current medications, past family history, past medical history, past social history, past surgical history and problem list.  Allergies as of 03/18/2018      Reactions   Amoxicillin    Erythromycin Base Dermatitis   Other    amoxicillan   Sulfa Antibiotics    Sulfamethoxazole Dermatitis      Medication List       Accurate as of March 18, 2018 12:06 PM. Always use your most recent med list.        albuterol 108 (90 Base) MCG/ACT inhaler Commonly known as:  PROAIR HFA Inhale 2 puffs into the lungs every 6 (six) hours as needed for wheezing or shortness of breath.   Azelastine-Fluticasone 137-50 MCG/ACT Susp Commonly known as:  DYMISTA Place 1 spray into the nose 2 (two) times daily.   clonazePAM  0.5 MG tablet Commonly known as:  KLONOPIN Take 0.5 mg by mouth 2 (two) times daily as needed for anxiety.   diphenhydrAMINE 12.5 MG/5ML elixir Commonly known as:  BENADRYL Take by mouth 4 (four) times daily as needed.   EPINEPHrine 0.3 mg/0.3 mL Soaj injection Commonly known as:  AUVI-Q Use as directed for severe allergic reactions   EPINEPHrine 0.3 mg/0.3 mL Soaj injection Commonly known as:  AUVI-Q Inject 0.3 mLs (0.3 mg total) into the muscle once for 1 dose. As directed for life-threatening allergic reactions   levocetirizine 5 MG tablet Commonly known as:  XYZAL Take 1 tablet (5 mg total) by mouth daily as needed for allergies.   loratadine 10 MG tablet Commonly known as:  CLARITIN Take 10 mg by mouth daily.   Olopatadine HCl 0.7 % Soln Commonly known as:  PAZEO Apply 1 drop to eye daily as needed.   ZOLOFT PO Take 150 mg by mouth daily.       Allergies  Allergen Reactions  . Amoxicillin   . Erythromycin Base Dermatitis  . Other     amoxicillan  . Sulfa Antibiotics   . Sulfamethoxazole Dermatitis   Review of systems: Review of systems negative except as noted in HPI / PMHx or noted below: Constitutional: Negative.  HENT: Negative.   Eyes: Negative.  Respiratory: Negative.   Cardiovascular: Negative.  Gastrointestinal: Negative.  Genitourinary: Negative.  Musculoskeletal: Negative.  Neurological: Negative.  Endo/Heme/Allergies: Negative.  Cutaneous: Negative.  Past Medical History:  Diagnosis Date  . Angio-edema   . Eczema   . Mild intermittent asthma 11/16/2016  . Urticaria    from medication    Family History  Problem Relation Age of Onset  . Food Allergy Father   . Bronchitis Father   . Food Allergy Sister     Social History   Socioeconomic History  . Marital status: Single    Spouse name: Not on file  . Number of children: Not on file  . Years of education: Not on file  . Highest education level: Not on file  Occupational  History  . Not on file  Social Needs  . Financial resource strain: Not on file  . Food insecurity:    Worry: Not on file    Inability: Not on file  . Transportation needs:    Medical: Not on file    Non-medical: Not on file  Tobacco Use  . Smoking status: Never Smoker  . Smokeless tobacco: Never Used  Substance and Sexual Activity  . Alcohol use: Not Currently  . Drug use: Not Currently  . Sexual activity: Not on file  Lifestyle  . Physical activity:    Days per week: Not on file    Minutes per session: Not on file  . Stress: Not on file  Relationships  . Social connections:    Talks on phone: Not on file    Gets together: Not on file    Attends religious service: Not on file    Active member of club or organization: Not on file    Attends meetings of clubs or organizations: Not on file    Relationship status: Not on file  . Intimate partner violence:    Fear of current or ex partner: Not on file    Emotionally abused: Not on file    Physically abused: Not on file    Forced sexual activity: Not on file  Other Topics Concern  . Not on file  Social History Narrative  . Not on file    I appreciate the opportunity to take part in Kerra's care. Please do not hesitate to contact me with questions.  Sincerely,   R. Jorene Guest, MD

## 2018-03-18 NOTE — Telephone Encounter (Signed)
Patient's insurance does not cover Pazeo. Optivar, Zaditor, Patanol, or Lastacraft is preferred. Please advise.

## 2018-03-18 NOTE — Assessment & Plan Note (Signed)
   Aeroallergen avoidance measures have been provided.  A refill prescription has been provided for levocetirizine, 5 mg daily as needed.  A refill prescription has been provided for Dymista (azelastine/fluticasone) nasal spray, 1 spray per nostril twice daily as needed.   Nasal saline spray (i.e., Simply Saline) or nasal saline lavage (i.e., NeilMed) is recommended as needed and prior to medicated nasal sprays.  The risks and benefits of aeroallergen immunotherapy have been discussed. The patient is interested in the possibility of initiating immunotherapy if insurance coverage is favorable. She will let us know how she would like to proceed.

## 2018-03-19 NOTE — Telephone Encounter (Signed)
Yes, may put in for the 2 generics. Thanks.

## 2018-03-20 ENCOUNTER — Telehealth: Payer: Self-pay

## 2018-03-20 ENCOUNTER — Other Ambulatory Visit: Payer: Self-pay

## 2018-03-20 MED ORDER — AZELASTINE HCL 0.1 % NA SOLN: 2.0000 | Freq: Two times a day (BID) | NASAL | 5 refills | Status: DC

## 2018-03-20 MED ORDER — FLUTICASONE PROPIONATE 50 MCG/ACT NA SUSP: 2.0000 | Freq: Two times a day (BID) | NASAL | 5 refills | Status: DC

## 2018-03-20 NOTE — Telephone Encounter (Signed)
Lastacaft, 1 gtt OU QD prn.

## 2018-03-20 NOTE — Telephone Encounter (Signed)
Fax from [facility] pharmacy:  Pazeo not covered:   Alternatives included:  Optivar, zaditor, patanol or lastacraft.  Please select alternative, thank you.

## 2018-03-20 NOTE — Telephone Encounter (Signed)
Sent in generics for fluticasone and azelastine

## 2018-03-21 MED ORDER — LASTACAFT 0.25 % OP SOLN: OPHTHALMIC | 5 refills | Status: DC

## 2018-03-21 NOTE — Telephone Encounter (Signed)
Prescription for Lastacaft sent in to pharmacy.

## 2018-06-27 DIAGNOSIS — R69 Illness, unspecified: Secondary | ICD-10-CM | POA: Diagnosis not present

## 2018-06-28 DIAGNOSIS — Z03818 Encounter for observation for suspected exposure to other biological agents ruled out: Secondary | ICD-10-CM | POA: Diagnosis not present

## 2018-07-03 DIAGNOSIS — R69 Illness, unspecified: Secondary | ICD-10-CM | POA: Diagnosis not present

## 2018-07-09 DIAGNOSIS — R69 Illness, unspecified: Secondary | ICD-10-CM | POA: Diagnosis not present

## 2018-07-11 DIAGNOSIS — R69 Illness, unspecified: Secondary | ICD-10-CM | POA: Diagnosis not present

## 2018-07-12 DIAGNOSIS — Z20828 Contact with and (suspected) exposure to other viral communicable diseases: Secondary | ICD-10-CM | POA: Diagnosis not present

## 2018-07-23 DIAGNOSIS — R69 Illness, unspecified: Secondary | ICD-10-CM | POA: Diagnosis not present

## 2018-07-25 DIAGNOSIS — R69 Illness, unspecified: Secondary | ICD-10-CM | POA: Diagnosis not present

## 2018-08-08 DIAGNOSIS — R69 Illness, unspecified: Secondary | ICD-10-CM | POA: Diagnosis not present

## 2018-08-15 DIAGNOSIS — R69 Illness, unspecified: Secondary | ICD-10-CM | POA: Diagnosis not present

## 2018-09-11 ENCOUNTER — Other Ambulatory Visit: Payer: Self-pay

## 2018-09-11 DIAGNOSIS — Z20822 Contact with and (suspected) exposure to covid-19: Secondary | ICD-10-CM

## 2018-09-12 LAB — NOVEL CORONAVIRUS, NAA: SARS-CoV-2, NAA: NOT DETECTED

## 2018-09-12 LAB — SPECIMEN STATUS REPORT

## 2018-10-23 ENCOUNTER — Other Ambulatory Visit: Payer: Self-pay

## 2018-10-23 DIAGNOSIS — Z20822 Contact with and (suspected) exposure to covid-19: Secondary | ICD-10-CM

## 2018-10-25 LAB — NOVEL CORONAVIRUS, NAA: SARS-CoV-2, NAA: NOT DETECTED

## 2018-11-25 ENCOUNTER — Other Ambulatory Visit: Payer: Self-pay

## 2018-11-25 DIAGNOSIS — Z20822 Contact with and (suspected) exposure to covid-19: Secondary | ICD-10-CM

## 2018-11-27 LAB — NOVEL CORONAVIRUS, NAA: SARS-CoV-2, NAA: NOT DETECTED

## 2018-12-18 ENCOUNTER — Telehealth: Payer: Self-pay

## 2018-12-18 DIAGNOSIS — T7800XA Anaphylactic reaction due to unspecified food, initial encounter: Secondary | ICD-10-CM

## 2018-12-18 MED ORDER — EPINEPHRINE 0.3 MG/0.3ML IJ SOAJ: INTRAMUSCULAR | 0 refills | Status: DC

## 2018-12-18 NOTE — Telephone Encounter (Signed)
Called patient and informed that EpiPen has been sent in. Patient has been scheduled to start allergy injections.

## 2018-12-18 NOTE — Telephone Encounter (Signed)
Yes, she may start immunotherapy injections.  Please use the immunotherapy orders that I wrote on March 18, 2018.  I believe that she wants to receive the injections in the [place] office.  Please make sure that she has set up a first shot appointment. Thank you.

## 2018-12-18 NOTE — Telephone Encounter (Signed)
Patient needs a refill request on the AUV-IQ. Also ready to start immunotherapy. Please call patient

## 2018-12-18 NOTE — Telephone Encounter (Signed)
Auvi-q sent into [facility] pharmacy. Dr. Verlin Fester please advise on Allergy shots.

## 2018-12-18 NOTE — Progress Notes (Addendum)
VIALS EXP 12-19-19 NEEDED MORE LABELS

## 2018-12-19 DIAGNOSIS — J3089 Other allergic rhinitis: Secondary | ICD-10-CM

## 2018-12-20 DIAGNOSIS — J301 Allergic rhinitis due to pollen: Secondary | ICD-10-CM

## 2019-01-03 ENCOUNTER — Ambulatory Visit: Payer: Self-pay

## 2019-01-22 ENCOUNTER — Ambulatory Visit: Payer: Self-pay

## 2019-01-29 ENCOUNTER — Ambulatory Visit (INDEPENDENT_AMBULATORY_CARE_PROVIDER_SITE_OTHER): Payer: BC Managed Care – PPO | Admitting: *Deleted

## 2019-01-29 ENCOUNTER — Other Ambulatory Visit: Payer: Self-pay

## 2019-01-29 DIAGNOSIS — J309 Allergic rhinitis, unspecified: Secondary | ICD-10-CM | POA: Diagnosis not present

## 2019-01-29 NOTE — Progress Notes (Signed)
Immunotherapy   Patient Details  Name: Locklyn Henriquez MRN: [id number] Date of Birth: March 06, 1990  01/29/2019  Rigoberto Noel started injections for  M-C-D-CR, G-WEEDS-T-DM Following schedule: A  Frequency: 1-2X Weekly with 72 hours in between injections Epi-Pen: Yes Consent signed and patient instructions given. Patient started allergy injections today and received .85mL of M-C-D-CR in the RUA and .7mL of G-WEEDS-T-DM in the LUA. Patient waited 30 minutes and did not experience any issues.    Mande Auvil Fernandez-Vernon 01/29/2019, 2:12 PM

## 2019-02-05 ENCOUNTER — Ambulatory Visit (INDEPENDENT_AMBULATORY_CARE_PROVIDER_SITE_OTHER): Payer: BC Managed Care – PPO

## 2019-02-05 DIAGNOSIS — J309 Allergic rhinitis, unspecified: Secondary | ICD-10-CM | POA: Diagnosis not present

## 2019-02-12 ENCOUNTER — Ambulatory Visit (INDEPENDENT_AMBULATORY_CARE_PROVIDER_SITE_OTHER): Payer: BC Managed Care – PPO | Admitting: *Deleted

## 2019-02-12 DIAGNOSIS — J309 Allergic rhinitis, unspecified: Secondary | ICD-10-CM | POA: Diagnosis not present

## 2019-02-19 ENCOUNTER — Ambulatory Visit (INDEPENDENT_AMBULATORY_CARE_PROVIDER_SITE_OTHER): Payer: BC Managed Care – PPO

## 2019-02-19 DIAGNOSIS — J309 Allergic rhinitis, unspecified: Secondary | ICD-10-CM

## 2019-02-26 ENCOUNTER — Ambulatory Visit (INDEPENDENT_AMBULATORY_CARE_PROVIDER_SITE_OTHER): Payer: BC Managed Care – PPO

## 2019-02-26 DIAGNOSIS — J309 Allergic rhinitis, unspecified: Secondary | ICD-10-CM | POA: Diagnosis not present

## 2019-03-11 ENCOUNTER — Ambulatory Visit (INDEPENDENT_AMBULATORY_CARE_PROVIDER_SITE_OTHER): Payer: BC Managed Care – PPO

## 2019-03-11 DIAGNOSIS — J309 Allergic rhinitis, unspecified: Secondary | ICD-10-CM

## 2019-03-21 ENCOUNTER — Ambulatory Visit (INDEPENDENT_AMBULATORY_CARE_PROVIDER_SITE_OTHER): Payer: BC Managed Care – PPO | Admitting: *Deleted

## 2019-03-21 DIAGNOSIS — J309 Allergic rhinitis, unspecified: Secondary | ICD-10-CM | POA: Diagnosis not present

## 2019-03-28 ENCOUNTER — Ambulatory Visit (INDEPENDENT_AMBULATORY_CARE_PROVIDER_SITE_OTHER): Payer: BC Managed Care – PPO

## 2019-03-28 DIAGNOSIS — J309 Allergic rhinitis, unspecified: Secondary | ICD-10-CM

## 2019-04-02 ENCOUNTER — Ambulatory Visit: Payer: Self-pay | Attending: Internal Medicine

## 2019-04-02 DIAGNOSIS — Z20822 Contact with and (suspected) exposure to covid-19: Secondary | ICD-10-CM

## 2019-04-03 LAB — NOVEL CORONAVIRUS, NAA: SARS-CoV-2, NAA: NOT DETECTED

## 2019-04-09 ENCOUNTER — Ambulatory Visit (INDEPENDENT_AMBULATORY_CARE_PROVIDER_SITE_OTHER): Payer: BC Managed Care – PPO

## 2019-04-09 DIAGNOSIS — J309 Allergic rhinitis, unspecified: Secondary | ICD-10-CM

## 2019-04-21 ENCOUNTER — Ambulatory Visit (INDEPENDENT_AMBULATORY_CARE_PROVIDER_SITE_OTHER): Payer: BC Managed Care – PPO

## 2019-04-21 DIAGNOSIS — J309 Allergic rhinitis, unspecified: Secondary | ICD-10-CM

## 2019-04-29 ENCOUNTER — Ambulatory Visit (INDEPENDENT_AMBULATORY_CARE_PROVIDER_SITE_OTHER): Payer: BC Managed Care – PPO

## 2019-04-29 DIAGNOSIS — J309 Allergic rhinitis, unspecified: Secondary | ICD-10-CM

## 2019-05-09 ENCOUNTER — Ambulatory Visit (INDEPENDENT_AMBULATORY_CARE_PROVIDER_SITE_OTHER): Payer: BC Managed Care – PPO

## 2019-05-09 DIAGNOSIS — J309 Allergic rhinitis, unspecified: Secondary | ICD-10-CM | POA: Diagnosis not present

## 2019-05-26 ENCOUNTER — Ambulatory Visit (INDEPENDENT_AMBULATORY_CARE_PROVIDER_SITE_OTHER): Payer: BC Managed Care – PPO

## 2019-05-26 DIAGNOSIS — J309 Allergic rhinitis, unspecified: Secondary | ICD-10-CM | POA: Diagnosis not present

## 2019-06-05 ENCOUNTER — Ambulatory Visit (INDEPENDENT_AMBULATORY_CARE_PROVIDER_SITE_OTHER): Payer: BC Managed Care – PPO

## 2019-06-05 DIAGNOSIS — J309 Allergic rhinitis, unspecified: Secondary | ICD-10-CM

## 2019-06-11 ENCOUNTER — Ambulatory Visit (INDEPENDENT_AMBULATORY_CARE_PROVIDER_SITE_OTHER): Payer: BC Managed Care – PPO

## 2019-06-11 DIAGNOSIS — J309 Allergic rhinitis, unspecified: Secondary | ICD-10-CM

## 2019-06-18 ENCOUNTER — Ambulatory Visit (INDEPENDENT_AMBULATORY_CARE_PROVIDER_SITE_OTHER): Payer: BC Managed Care – PPO

## 2019-06-18 DIAGNOSIS — J309 Allergic rhinitis, unspecified: Secondary | ICD-10-CM | POA: Diagnosis not present

## 2019-06-25 ENCOUNTER — Ambulatory Visit (INDEPENDENT_AMBULATORY_CARE_PROVIDER_SITE_OTHER): Payer: BC Managed Care – PPO

## 2019-06-25 DIAGNOSIS — J309 Allergic rhinitis, unspecified: Secondary | ICD-10-CM | POA: Diagnosis not present

## 2019-07-04 ENCOUNTER — Ambulatory Visit (INDEPENDENT_AMBULATORY_CARE_PROVIDER_SITE_OTHER): Payer: BC Managed Care – PPO

## 2019-07-04 DIAGNOSIS — J309 Allergic rhinitis, unspecified: Secondary | ICD-10-CM | POA: Diagnosis not present

## 2019-07-11 ENCOUNTER — Ambulatory Visit (INDEPENDENT_AMBULATORY_CARE_PROVIDER_SITE_OTHER): Payer: BC Managed Care – PPO

## 2019-07-11 DIAGNOSIS — J309 Allergic rhinitis, unspecified: Secondary | ICD-10-CM | POA: Diagnosis not present

## 2019-07-25 ENCOUNTER — Ambulatory Visit (INDEPENDENT_AMBULATORY_CARE_PROVIDER_SITE_OTHER): Payer: BC Managed Care – PPO

## 2019-07-25 DIAGNOSIS — J309 Allergic rhinitis, unspecified: Secondary | ICD-10-CM | POA: Diagnosis not present

## 2019-08-05 ENCOUNTER — Ambulatory Visit (INDEPENDENT_AMBULATORY_CARE_PROVIDER_SITE_OTHER): Payer: BC Managed Care – PPO

## 2019-08-05 DIAGNOSIS — J309 Allergic rhinitis, unspecified: Secondary | ICD-10-CM

## 2019-08-15 ENCOUNTER — Ambulatory Visit (INDEPENDENT_AMBULATORY_CARE_PROVIDER_SITE_OTHER): Payer: BC Managed Care – PPO

## 2019-08-15 DIAGNOSIS — J309 Allergic rhinitis, unspecified: Secondary | ICD-10-CM

## 2019-08-25 ENCOUNTER — Ambulatory Visit (INDEPENDENT_AMBULATORY_CARE_PROVIDER_SITE_OTHER): Payer: BC Managed Care – PPO

## 2019-08-25 DIAGNOSIS — J309 Allergic rhinitis, unspecified: Secondary | ICD-10-CM | POA: Diagnosis not present

## 2019-09-03 ENCOUNTER — Ambulatory Visit (INDEPENDENT_AMBULATORY_CARE_PROVIDER_SITE_OTHER): Payer: BC Managed Care – PPO

## 2019-09-03 DIAGNOSIS — J309 Allergic rhinitis, unspecified: Secondary | ICD-10-CM | POA: Diagnosis not present

## 2019-09-12 ENCOUNTER — Ambulatory Visit (INDEPENDENT_AMBULATORY_CARE_PROVIDER_SITE_OTHER): Payer: BC Managed Care – PPO | Admitting: *Deleted

## 2019-09-12 DIAGNOSIS — J309 Allergic rhinitis, unspecified: Secondary | ICD-10-CM | POA: Diagnosis not present

## 2019-09-19 ENCOUNTER — Ambulatory Visit (INDEPENDENT_AMBULATORY_CARE_PROVIDER_SITE_OTHER): Payer: BC Managed Care – PPO | Admitting: *Deleted

## 2019-09-19 DIAGNOSIS — J309 Allergic rhinitis, unspecified: Secondary | ICD-10-CM | POA: Diagnosis not present

## 2019-09-24 ENCOUNTER — Emergency Department (HOSPITAL_COMMUNITY): Payer: BC Managed Care – PPO

## 2019-09-24 ENCOUNTER — Encounter (HOSPITAL_COMMUNITY): Payer: Self-pay | Admitting: *Deleted

## 2019-09-24 ENCOUNTER — Emergency Department (HOSPITAL_COMMUNITY)
Admission: EM | Admit: 2019-09-24 | Discharge: 2019-09-24 | Disposition: A | Discharge status: Home / Self Care | Payer: BC Managed Care – PPO | Attending: Emergency Medicine | Admitting: Emergency Medicine

## 2019-09-24 DIAGNOSIS — R110 Nausea: Secondary | ICD-10-CM | POA: Insufficient documentation

## 2019-09-24 DIAGNOSIS — R1032 Left lower quadrant pain: Secondary | ICD-10-CM

## 2019-09-24 DIAGNOSIS — N898 Other specified noninflammatory disorders of vagina: Secondary | ICD-10-CM | POA: Insufficient documentation

## 2019-09-24 DIAGNOSIS — Z87442 Personal history of urinary calculi: Secondary | ICD-10-CM | POA: Diagnosis not present

## 2019-09-24 LAB — COMPREHENSIVE METABOLIC PANEL
ALT: 13 U/L (ref 0–44)
AST: 16 U/L (ref 15–41)
Albumin: 4.5 g/dL (ref 3.5–5.0)
Alkaline Phosphatase: 45 U/L (ref 38–126)
Anion gap: 11 (ref 5–15)
BUN: 8 mg/dL (ref 6–20)
CO2: 25 mmol/L (ref 22–32)
Calcium: 9.7 mg/dL (ref 8.9–10.3)
Chloride: 105 mmol/L (ref 98–111)
Creatinine, Ser: 0.65 mg/dL (ref 0.44–1.00)
GFR calc Af Amer: >60 mL/min (ref >60)
GFR calc non Af Amer: >60 mL/min (ref >60)
Glucose, Bld: 110 mg/dL — ABNORMAL HIGH (ref 70–99)
Potassium: 3.7 mmol/L (ref 3.5–5.1)
Sodium: 141 mmol/L (ref 135–145)
Total Bilirubin: 0.9 mg/dL (ref 0.3–1.2)
Total Protein: 7.6 g/dL (ref 6.5–8.1)

## 2019-09-24 LAB — URINALYSIS, ROUTINE W REFLEX MICROSCOPIC
Bilirubin Urine: NEGATIVE
Glucose, UA: NEGATIVE mg/dL
Hgb urine dipstick: NEGATIVE
Ketones, ur: NEGATIVE mg/dL
Leukocytes,Ua: NEGATIVE
Nitrite: NEGATIVE
Protein, ur: NEGATIVE mg/dL
Specific Gravity, Urine: 1.001 — ABNORMAL LOW (ref 1.005–1.030)
pH: 7 (ref 5.0–8.0)

## 2019-09-24 LAB — CBC
HCT: 40 % (ref 36.0–46.0)
Hemoglobin: 14.4 g/dL (ref 12.0–15.0)
MCH: 32.7 pg (ref 26.0–34.0)
MCHC: 36 g/dL (ref 30.0–36.0)
MCV: 90.7 fL (ref 80.0–100.0)
Platelets: 194 10*3/uL (ref 150–400)
RBC: 4.41 MIL/uL (ref 3.87–5.11)
RDW: 13 % (ref 11.5–15.5)
WBC: 7.3 10*3/uL (ref 4.0–10.5)
nRBC: 0 % (ref 0.0–0.2)

## 2019-09-24 LAB — WET PREP, GENITAL
Clue Cells Wet Prep HPF POC: NONE SEEN
Sperm: NONE SEEN
Trich, Wet Prep: NONE SEEN
Yeast Wet Prep HPF POC: NONE SEEN

## 2019-09-24 LAB — I-STAT BETA HCG BLOOD, ED (MC, WL, AP ONLY): I-stat hCG, quantitative: <5 m[IU]/mL (ref <5)

## 2019-09-24 LAB — LIPASE, BLOOD: Lipase: 33 U/L (ref 11–51)

## 2019-09-24 MED ORDER — ONDANSETRON 4 MG PO TBDP: 4.0000 mg | ORAL_TABLET | Freq: Three times a day (TID) | ORAL | 0 refills | Status: DC | PRN

## 2019-09-24 MED ORDER — MORPHINE SULFATE (PF) 4 MG/ML IV SOLN
4.0000 mg | Freq: Once | INTRAVENOUS | Status: AC
  Administered 2019-09-24: 4 mg via INTRAMUSCULAR
  Filled 2019-09-24: qty 1

## 2019-09-24 MED ORDER — ONDANSETRON 4 MG PO TBDP
4.0000 mg | ORAL_TABLET | Freq: Once | ORAL | Status: AC
  Administered 2019-09-24: 4 mg via ORAL
  Filled 2019-09-24: qty 1

## 2019-09-24 MED ORDER — DICYCLOMINE HCL 20 MG PO TABS: 20.0000 mg | ORAL_TABLET | Freq: Two times a day (BID) | ORAL | 0 refills | Status: DC

## 2019-09-24 MED ORDER — IBUPROFEN 600 MG PO TABS: 600.0000 mg | ORAL_TABLET | Freq: Four times a day (QID) | ORAL | 0 refills | Status: DC | PRN

## 2019-09-24 NOTE — Discharge Instructions (Signed)
Your CT scan was negative for any kidney stones or ureteral stones.  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  This is unlikely to be related to your symptoms today however I do recommend following up with your primary care doctor and to mention this.  Your lab work was reassuring there is no abnormalities that explain your symptoms today.  Given the lack of other explanations I suspect that this may be related to premenstrual pain.  May always return to the emergency department for any new or concerning symptoms.  Please drink plenty of water take Bentyl, ibuprofen and Zofran as prescribed you may also take 1000 mg of Tylenol and alternate between Tylenol and ibuprofen as of you taking something every 3 hours.  Each of these medications should be used every 6 hours as needed for pain.

## 2019-09-24 NOTE — ED Notes (Signed)
Assisted [place], [place] with a pelvic exam. Pt tolerated well.

## 2019-09-24 NOTE — ED Notes (Signed)
Stevphen Meuse, PA at Bedside

## 2019-09-24 NOTE — ED Triage Notes (Signed)
BIB EMS with 3 days of N/V abd pain and anxiety.  History of kidney infections. Dark urine with back pain. Late starting monthly cycle. 170/100-114-98% CBG 120

## 2019-09-24 NOTE — ED Provider Notes (Signed)
[facility]-EMERGENCY DEPT Provider Note   CSN: [id number] Arrival date & time: 09/24/19  1344     History Chief Complaint  Patient presents with  . Abdominal Pain  . Nausea  . Emesis  . Anxiety    Joy Wade is a 29 y.o. female.  HPI Patient is a 29 year old female with a past medical history significant for frequent kidney stones she states that she has had three one of which required lithotripsy because she had a full obstruction.  She states that for the past 3 days she has had abdominal pain/left flank pain.  She states that it is sharp and stabbing and consistent with pain that she has had in the past from her kidney stones.  She states she also has had similar pain in the past from her menstrual cycle.  She states that she feels that she should be starting her menstrual cycle now but has not had any vaginal bleeding yet.  She denies any vaginal pain but does endorse some vaginal discharge she states that it is relatively normal for her but she is unsure whether it has changed.  She denies any new sexual partners or unprotected sex recently.  She states that she has had normal bowel movements no constipation or diarrhea.  She had her most recent bowel movement this morning and states that she is still passing flatus.  She states that she has to Valsalva slightly in order to fully empty her bladder but states that this is something that she has had to do in the past and states that she attributed to her OCD.  She denies any nausea or vomiting other than one episode of emesis that occurred 2 days ago.  Denies any chest pain or shortness of breath.  Denies any fevers or chills.  Pertinent surgical history includes lithotripsy and appendectomy.     Past Medical History:  Diagnosis Date  . Angio-edema   . Eczema   . Mild intermittent asthma 11/16/2016  . Urticaria    from medication    Patient Active Problem List   Diagnosis Date Noted  . Hand  dermatitis 03/18/2018  . Food allergy 11/16/2016  . Oral allergy syndrome 11/16/2016  . Perennial and seasonal allergic rhinitis 11/16/2016  . Allergic conjunctivitis 11/16/2016  . Mild intermittent asthma 11/16/2016    Past Surgical History:  Procedure Laterality Date  . APPENDECTOMY  2006  . LITHOTRIPSY  2009  . TYMPANOSTOMY TUBE PLACEMENT       OB History   No obstetric history on file.     Family History  Problem Relation Age of Onset  . Food Allergy Father   . Bronchitis Father   . Food Allergy Sister     Social History   Tobacco Use  . Smoking status: Never Smoker  . Smokeless tobacco: Never Used  Vaping Use  . Vaping Use: Never used  Substance Use Topics  . Alcohol use: Not Currently  . Drug use: Not Currently    Home Medications Prior to Admission medications   Medication Sig Start Date End Date Taking? Authorizing Provider  albuterol (PROAIR HFA) 108 (90 Base) MCG/ACT inhaler Inhale 2 puffs into the lungs every 6 (six) hours as needed for wheezing or shortness of breath. 01/26/17   Bobbitt, Heywood Iles, MD  azelastine (ASTELIN) 0.1 % nasal spray Place 2 sprays into both nostrils 2 (two) times daily. Use in each nostril as directed 03/20/18   Bobbitt, Heywood Iles, MD  Azelastine-Fluticasone [facility])  137-50 MCG/ACT SUSP Place 1 spray into the nose 2 (two) times daily as needed. 03/18/18   Bobbitt, Heywood Iles, MD  clonazePAM (KLONOPIN) 0.5 MG tablet Take 0.5 mg by mouth 2 (two) times daily as needed for anxiety.    [provider]  dicyclomine (BENTYL) 20 MG tablet Take 1 tablet (20 mg total) by mouth 2 (two) times daily. 09/24/19   Gailen Shelter, PA  diphenhydrAMINE (BENADRYL) 12.5 MG/5ML elixir Take by mouth 4 (four) times daily as needed.    [provider]  EPINEPHrine (AUVI-Q) 0.3 mg/0.3 mL IJ SOAJ injection Use as directed for severe allergic reactions 12/18/18   Bobbitt, Heywood Iles, MD  fluticasone [facility]) 50 MCG/ACT nasal spray  Place 2 sprays into both nostrils 2 (two) times daily. 03/20/18   Bobbitt, Heywood Iles, MD  ibuprofen (ADVIL) 600 MG tablet Take 1 tablet (600 mg total) by mouth every 6 (six) hours as needed. 09/24/19   Gailen Shelter, PA  LASTACAFT 0.25 % SOLN Place 1 drop in each eye daily as needed 03/21/18   Bobbitt, Heywood Iles, MD  levocetirizine (XYZAL) 5 MG tablet Take 1 tablet (5 mg total) by mouth daily as needed for allergies. 03/18/18   Bobbitt, Heywood Iles, MD  loratadine (CLARITIN) 10 MG tablet Take 10 mg by mouth daily.    [provider]  Olopatadine HCl (PAZEO) 0.7 % SOLN Apply 1 drop to eye daily as needed. 03/18/18   Bobbitt, Heywood Iles, MD  ondansetron (ZOFRAN ODT) 4 MG disintegrating tablet Take 1 tablet (4 mg total) by mouth every 8 (eight) hours as needed for nausea or vomiting. 09/24/19   Gailen Shelter, PA  Sertraline HCl (ZOLOFT PO) Take 150 mg by mouth daily.    [provider]    Allergies    Amoxicillin, Erythromycin base, Other, Sulfa antibiotics, and Sulfamethoxazole  Review of Systems   Review of Systems  Constitutional: Negative for chills and fever.  HENT: Negative for congestion.   Eyes: Negative for pain.  Respiratory: Negative for cough and shortness of breath.   Cardiovascular: Negative for chest pain and leg swelling.  Gastrointestinal: Positive for abdominal pain, nausea and vomiting. Negative for diarrhea.  Genitourinary: Positive for difficulty urinating and flank pain. Negative for dysuria, vaginal bleeding and vaginal pain.  Musculoskeletal: Negative for myalgias.  Skin: Negative for rash.  Neurological: Negative for dizziness and headaches.    Physical Exam Updated Vital Signs BP (!) 150/104 (BP Location: Right Arm)   Pulse 91   Temp 99.3 F (37.4 C) (Oral)   Resp 17   Ht 5\' 1"  (1.549 m)   Wt 70.3 kg   SpO2 100%   BMI 29.29 kg/m   Physical Exam Vitals and nursing note reviewed.  Constitutional:      General: She is not in acute  distress.    Comments: Pleasant 29 year old female appears stated age able answer questions appropriately follow commands  HENT:     Head: Normocephalic and atraumatic.     Nose: Nose normal.     Mouth/Throat:     Mouth: Mucous membranes are moist.  Eyes:     General: No scleral icterus. Cardiovascular:     Rate and Rhythm: Normal rate and regular rhythm.     Pulses: Normal pulses.     Heart sounds: Normal heart sounds.  Pulmonary:     Effort: Pulmonary effort is normal. No respiratory distress.     Breath sounds: No wheezing.  Abdominal:  Palpations: Abdomen is soft.     Tenderness: There is no abdominal tenderness. There is left CVA tenderness. There is no right CVA tenderness, guarding or rebound.  Genitourinary:    Comments: Vulva without lesions or abnormality Vaginal canal without abnormal discharge or lesion Cervix appears normal, is closed No adnexal tenderness or CMT Musculoskeletal:     Cervical back: Normal range of motion.     Right lower leg: No edema.     Left lower leg: No edema.  Skin:    General: Skin is warm and dry.     Capillary Refill: Capillary refill takes less than 2 seconds.  Neurological:     Mental Status: She is alert. Mental status is at baseline.  Psychiatric:     Comments: Mildly rapid speech, good eye contact, mildly anxious appearing.     ED Results / Procedures / Treatments   Labs (all labs ordered are listed, but only abnormal results are displayed) Labs Reviewed  WET PREP, GENITAL - Abnormal; Notable for the following components:      Result Value   WBC, Wet Prep HPF POC MANY (*)    All other components within normal limits  COMPREHENSIVE METABOLIC PANEL - Abnormal; Notable for the following components:   Glucose, Bld 110 (*)    All other components within normal limits  URINALYSIS, ROUTINE W REFLEX MICROSCOPIC - Abnormal; Notable for the following components:   Color, Urine STRAW (*)    Specific Gravity, Urine 1.001 (*)     All other components within normal limits  LIPASE, BLOOD  CBC  I-STAT BETA HCG BLOOD, ED ([facility], [facility], [facility] ONLY)  GC/CHLAMYDIA PROBE AMP ([place]) NOT AT [facility]    EKG None  Radiology CT Renal Stone Study  Result Date: 09/24/2019 CLINICAL DATA:  Nausea vomiting and flank pain EXAM: CT ABDOMEN AND PELVIS WITHOUT CONTRAST TECHNIQUE: Multidetector CT imaging of the abdomen and pelvis was performed following the standard protocol without IV contrast. COMPARISON:  None. FINDINGS: Lower chest: No acute abnormality. Hepatobiliary: No focal liver abnormality is seen. No gallstones, gallbladder wall thickening, or biliary dilatation. Pancreas: Unremarkable. No pancreatic ductal dilatation or surrounding inflammatory changes. Spleen: Borderline to slightly enlarged, measuring up to 13 cm AP. Adrenals/Urinary Tract: Adrenal glands are unremarkable. Kidneys are normal, without renal calculi, focal lesion, or hydronephrosis. Bladder is unremarkable. Stomach/Bowel: Stomach is within normal limits. Appendix appears normal. No evidence of bowel wall thickening, distention, or inflammatory changes. Vascular/Lymphatic: No significant vascular findings are present. No enlarged abdominal or pelvic lymph nodes. Reproductive: Uterus and bilateral adnexa are unremarkable. Other: No abdominal wall hernia or abnormality. No abdominopelvic ascites. Musculoskeletal: No acute or significant osseous findings. IMPRESSION: 1. Negative for hydronephrosis or ureteral stone. 2. Borderline to mild splenomegaly. Electronically Signed   By: Jasmine PangKim  Fujinaga M.D.   On: 09/24/2019 20:34    Procedures Procedures (including critical care time)  Medications Ordered in ED Medications  morphine 4 MG/ML injection 4 mg (4 mg Intramuscular Given 09/24/19 1844)  ondansetron (ZOFRAN-ODT) disintegrating tablet 4 mg (4 mg Oral Given 09/24/19 1844)    ED Course  I have reviewed the triage vital signs and the nursing notes.  Pertinent labs & imaging  results that were available during my care of the patient were reviewed by me and considered in my medical decision making (see chart for details).    MDM Rules/Calculators/A&P  Patient is 29 year old female with past medical history of frequent kidney stones and anxiety.  She has had a appendectomy in the past.  She is endorsing left-sided flank/groin pain questionable vaginal discharge.  Low suspicion for ovarian torsion but will do pelvic exam to evaluate for this or cervical motion tenderness indicating PID.  Proxy test is negative doubt ectopic.  CMP without any electrolyte abnormalities.  CBC without leukocytosis or anemia.  Lipase within normal limits.pancreatitis.  Urinalysis is unremarkable.  There is no evidence of hematuria however as patient does have a history of large occlusive kidney stones will obtain CT renal study to rule out kidney stone.  She decision-making physician patient she is agreeable to plan.  Will provide patient with IM morphine 4 mg and Zofran ODT.  Pelvic exam was without any cervical motion tenderness or adnexal tenderness.  Doubt ovarian torsion PID or other intrapelvic emergency.  CT renal stone pending at this time.  CT renal study negative for kidney stones.  There is incidentally noted enlarged spleen.  Patient has no decreased platelet count doubt mono or other acute abnormality.  This may be baseline for patient.  Patient states her symptoms are significantly improved at this time.  Her tachycardia had resolved prior to my evaluation and morphine.  She is well-appearing at this time is afebrile does have mildly elevated blood pressure which will be rechecked.  She will follow up with PCP given her reassuring work-up today.  Given return precautions is able to tolerate p.o. and is ambulatory.  Pelvic exam was reassuring.  Patient and her father were updated to plan.  Will discharge with Bentyl, Zofran, ibuprofen and Tylenol  recommendations.  Final Clinical Impression(s) / ED Diagnoses Final diagnoses:  Left lower quadrant abdominal pain  Vaginal discharge  Nausea    Rx / DC Orders ED Discharge Orders         Ordered    ondansetron (ZOFRAN ODT) 4 MG disintegrating tablet  Every 8 hours PRN        09/24/19 2049    dicyclomine (BENTYL) 20 MG tablet  2 times daily        09/24/19 2049    ibuprofen (ADVIL) 600 MG tablet  Every 6 hours PRN        09/24/19 2049           Gailen Shelter, [place] 09/24/19 2053    Lorre Nick, MD 10/01/19 [phone]

## 2019-09-25 LAB — GC/CHLAMYDIA PROBE AMP (~~LOC~~) NOT AT ARMC
Chlamydia: NEGATIVE
Comment: NEGATIVE
Comment: NORMAL
Neisseria Gonorrhea: NEGATIVE

## 2019-10-01 ENCOUNTER — Ambulatory Visit: Payer: Self-pay

## 2019-10-01 ENCOUNTER — Ambulatory Visit (INDEPENDENT_AMBULATORY_CARE_PROVIDER_SITE_OTHER): Payer: BC Managed Care – PPO | Admitting: *Deleted

## 2019-10-01 DIAGNOSIS — J309 Allergic rhinitis, unspecified: Secondary | ICD-10-CM | POA: Diagnosis not present

## 2019-10-01 NOTE — Telephone Encounter (Signed)
   KR 1  Joy Wade Female, 29 y.o., 1990-08-28 MRN:  [id number] Phone:  [phone] Judie Petit) PCP:  Maudie Flakes, FNP Coverage:  Valinda Hoar [employer] Ppo Message from Geronimo Boot sent at 10/01/2019 2:11 PM EDT  Summary: Clinical Advice   Patient would like to speak with nurse in regards to ct scan results. 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         Call History   Type Contact Phone  10/01/2019 02:09 PM EDT Phone (Incoming) Joy Wade, Joy Wade (Self) [phone] (H)  User: Dalphine Handing A   Pt. Reports she saw her CT Scan report and is concerned because it mentions the appendix "is normal." Pt. 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 States she has called [employer] and Patient Experience with no help. Instructed pt. To have her PCP assist her with looking in to this. Verbalizes understanding.

## 2019-10-10 ENCOUNTER — Ambulatory Visit (INDEPENDENT_AMBULATORY_CARE_PROVIDER_SITE_OTHER): Payer: BC Managed Care – PPO

## 2019-10-10 DIAGNOSIS — J309 Allergic rhinitis, unspecified: Secondary | ICD-10-CM

## 2019-10-20 ENCOUNTER — Ambulatory Visit (INDEPENDENT_AMBULATORY_CARE_PROVIDER_SITE_OTHER): Payer: BC Managed Care – PPO | Admitting: *Deleted

## 2019-10-20 DIAGNOSIS — J309 Allergic rhinitis, unspecified: Secondary | ICD-10-CM | POA: Diagnosis not present

## 2019-11-03 ENCOUNTER — Ambulatory Visit (INDEPENDENT_AMBULATORY_CARE_PROVIDER_SITE_OTHER): Payer: BC Managed Care – PPO

## 2019-11-03 DIAGNOSIS — J309 Allergic rhinitis, unspecified: Secondary | ICD-10-CM

## 2019-11-12 ENCOUNTER — Ambulatory Visit (INDEPENDENT_AMBULATORY_CARE_PROVIDER_SITE_OTHER): Payer: BC Managed Care – PPO | Admitting: *Deleted

## 2019-11-12 DIAGNOSIS — J309 Allergic rhinitis, unspecified: Secondary | ICD-10-CM

## 2019-11-17 DIAGNOSIS — J3089 Other allergic rhinitis: Secondary | ICD-10-CM

## 2019-11-17 NOTE — Progress Notes (Signed)
VIALS EXP 11-16-20 °

## 2019-11-18 DIAGNOSIS — J3081 Allergic rhinitis due to animal (cat) (dog) hair and dander: Secondary | ICD-10-CM | POA: Diagnosis not present

## 2019-11-19 ENCOUNTER — Ambulatory Visit (INDEPENDENT_AMBULATORY_CARE_PROVIDER_SITE_OTHER): Payer: BC Managed Care – PPO | Admitting: *Deleted

## 2019-11-19 DIAGNOSIS — J309 Allergic rhinitis, unspecified: Secondary | ICD-10-CM

## 2019-11-20 ENCOUNTER — Telehealth: Payer: Self-pay | Admitting: Allergy and Immunology

## 2019-11-20 NOTE — Telephone Encounter (Signed)
Per Candice in billing: Patient needs to schedule an office visit per insurance requirements. Insurance requires an injection patient to be seen once a year for them to continue covering allergy injections.

## 2019-11-20 NOTE — Telephone Encounter (Signed)
I called patient to inform her of the message. Patient said she was aware and said that she'd call back on her lunch break to schedule an appointment.

## 2019-11-24 ENCOUNTER — Telehealth: Payer: Self-pay | Admitting: *Deleted

## 2019-11-24 ENCOUNTER — Ambulatory Visit (INDEPENDENT_AMBULATORY_CARE_PROVIDER_SITE_OTHER): Payer: BC Managed Care – PPO | Admitting: *Deleted

## 2019-11-24 DIAGNOSIS — J309 Allergic rhinitis, unspecified: Secondary | ICD-10-CM

## 2019-11-24 NOTE — Telephone Encounter (Signed)
Patient keeps having large local reactions with her G-Weeds-T-DM vial when trying to exceed past 0.35 on her Gold. Please advise the best course of action.

## 2019-11-24 NOTE — Telephone Encounter (Signed)
It looks like she has not been seen in the office since March 2020.  Please hold her at 0.35 cc of gold and ask her to be seen by me or one of the PAs for follow-up. Thanks.

## 2019-11-25 NOTE — Telephone Encounter (Signed)
Patient's allergen flow sheet has been updated to reflect these changes. Called and left a voicemail asking for patient to return call to discuss and get her scheduled for a follow up visit regarding her injections.

## 2019-12-03 ENCOUNTER — Ambulatory Visit (INDEPENDENT_AMBULATORY_CARE_PROVIDER_SITE_OTHER): Payer: BC Managed Care – PPO

## 2019-12-03 DIAGNOSIS — J309 Allergic rhinitis, unspecified: Secondary | ICD-10-CM | POA: Diagnosis not present

## 2019-12-07 NOTE — Patient Instructions (Addendum)
Perennial and seasonal allergic rhinitis Continue Zyrtec 10 mg once a day as needed for runny nose or itching. Start taking an antihistamine twice a day one day prior to allergy injections, the day of allergy injections, and the day after allergy injections. Caution as this can make you drowsy. Then on the other days go back to Zyrtec 10 mg once a day.  We will have the injection room back you down to 0.1 of 10,000 and follow schedule A. Continue fluticasone nasal spray 1-2 sprays each nostril once a day as needed Start azelastine nasal spray 1-2 sprays each nostril up to 2 times a day as needed for runny nose or drainage down throat May use saline nasal spray or saline nasal rinse for nasal symptoms. Use this prior to any medicated nasal sprays.  Allergic conjunctivits May use Pataday 0.2% 1 drop each eye once a day as needed for itchy watery eyes  Food allergy Avoid celery, carrot, and cabbage. Also avoid any fresh raw fruits or vegetables which cause mouth or ear itching. In case of an allergic reaction, give Benadryl 4 teaspoonfuls every 4 hours, and if life-threatening symptoms occur, inject with AuviQ 0.3 mg.  Mild intermittent asthma Continue albuterol 2 puffs every 4 hours as needed for cough, wheeze, tightness in chest, or shortness of breath  Hand dermatitis Continue daily moisturizing program  Please let us know if this treatment plan is not working well for you Schedule a follow up appointment in 3 months

## 2019-12-08 ENCOUNTER — Ambulatory Visit: Payer: BC Managed Care – PPO | Admitting: Family

## 2019-12-08 ENCOUNTER — Other Ambulatory Visit: Payer: Self-pay

## 2019-12-08 ENCOUNTER — Encounter: Payer: Self-pay | Admitting: Family

## 2019-12-08 VITALS — BP 132/90 | HR 86 | Temp 98.6°F | Resp 18 | Ht 62.0 in | Wt 159.0 lb

## 2019-12-08 DIAGNOSIS — T781XXD Other adverse food reactions, not elsewhere classified, subsequent encounter: Secondary | ICD-10-CM | POA: Diagnosis not present

## 2019-12-08 DIAGNOSIS — J4520 Mild intermittent asthma, uncomplicated: Secondary | ICD-10-CM | POA: Diagnosis not present

## 2019-12-08 DIAGNOSIS — T7800XD Anaphylactic reaction due to unspecified food, subsequent encounter: Secondary | ICD-10-CM

## 2019-12-08 DIAGNOSIS — H1013 Acute atopic conjunctivitis, bilateral: Secondary | ICD-10-CM

## 2019-12-08 DIAGNOSIS — T7819XD Other adverse food reactions, not elsewhere classified, subsequent encounter: Secondary | ICD-10-CM

## 2019-12-08 DIAGNOSIS — J3089 Other allergic rhinitis: Secondary | ICD-10-CM | POA: Diagnosis not present

## 2019-12-08 MED ORDER — EPINEPHRINE 0.3 MG/0.3ML IJ SOAJ
INTRAMUSCULAR | 0 refills | Status: DC
Start: 2019-12-08 — End: 2020-12-13

## 2019-12-08 MED ORDER — AZELASTINE HCL 0.1 % NA SOLN
NASAL | 5 refills | Status: DC
Start: 2019-12-08 — End: 2020-12-13

## 2019-12-08 NOTE — Progress Notes (Signed)
[address] [name] [address] Dept: [phone]  FOLLOW UP NOTE  Patient ID: Joy Wade, female    DOB: Dec 27, 1990  Age: 29 y.o. MRN: [id number] Date of Office Visit: 12/08/2019  Assessment  Chief Complaint: Allergic Rhinitis  (yearly. pollen injection was decreased due to hives)  HPI Joy Wade is a 29 year old female who presents today for follow-up of perennial and seasonal allergic rhinitis, allergic conjunctivitis, food allergy, mild intermittent asthma, and hand dermatitis.  She was last seen on March 18, 2018 by Dr. Nunzio Cobbs.  Seasonal and perennial allergic rhinitis is reported as moderately controlled with Zyrtec 10 mg once a day, Flonase nasal spray as needed, and saline nasal rinses as needed.  She reports occasional postnasal drip and denies rhinorrhea and nasal congestion.  She is currently receiving allergy injections and is having problems with large local reactions.  She is currently receiving 0.35 cc in both ofher 1:10,000 vials.  She reports large local reactions for the past 5 weeks that are anywhere from half dollar sized to a little bit bigger.  She reports hives at site injection.  These occur as the day progresses.  She does not have hives anywhere else and denies any other symptoms.  She continues to avoid celery, carrot, cabbage, fresh/raw fruits and vegetables.  She reports approximately 4 weeks ago she had celery in chicken salad and did not have any problems.  She then again tried a different chicken salad with celery in it approximately 2 weeks ago and did not have any problems.  Since her last office visit she has not had to use her Auvi-Q device.  Mild intermittent asthma is reported as moderately controlled with albuterol as needed.  She reports tightness in her chest at times, that feels more like anxiety.  She denies any coughing, wheezing, shortness of breath, and nocturnal awakenings.  Since her last office visit she has not  required any trips to the emergency room or urgent care due to breathing problems.  She uses her albuterol inhaler 1-2 times every couple weeks.  She has had 1 round of steroids given due to drainage going down the back of her throat.  Hand dermatitis is reported as moderately controlled with Aveeno lotion and Jojoba oil.  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   Drug Allergies:  Allergies  Allergen Reactions   Amoxicillin    Erythromycin Base Dermatitis   Other     amoxicillan   Sulfa Antibiotics    Sulfamethoxazole Dermatitis    Review of Systems: Review of Systems  Constitutional: Negative for chills and fever.  HENT:       Denies rhinorrhea and nasal congestion. Reports occasional post nasal drip  Eyes:       Denies itchy watery eyes  Respiratory: Negative for cough, shortness of breath and wheezing.        Reports occasional tightness in chest" that feels more like anxiety"  Cardiovascular: Negative for chest pain and palpitations.  Gastrointestinal: Negative for abdominal pain.  Genitourinary: Negative for dysuria.  Skin: Negative for itching and rash.  Neurological: Positive for headaches.       Reports occasional headaches  Endo/Heme/Allergies: Positive for environmental allergies.     Physical Exam: BP 132/90 (BP Location: Right Leg, Patient Position: Sitting, Cuff Size: Normal)    Pulse 86    Temp 98.6 F (37 C) (Temporal)    Resp 18  Ht 5\' 2"  (1.575 m)    Wt 159 lb 0.6 oz (72.1 kg)    SpO2 99%    BMI 29.09 kg/m    Physical Exam Constitutional:      Appearance: Normal appearance.  HENT:     Head: Normocephalic and atraumatic.     Comments: Pharynx normal. Eyes normal. Ears normal. Nose normal    Right Ear: Tympanic membrane, ear canal and external ear normal.     Left Ear: Tympanic membrane, ear canal and external ear normal.     Nose: Nose normal.     Mouth/Throat:     Mouth: Mucous membranes are  moist.     Pharynx: Oropharynx is clear.  Eyes:     Conjunctiva/sclera: Conjunctivae normal.  Cardiovascular:     Rate and Rhythm: Regular rhythm.     Heart sounds: Normal heart sounds.  Pulmonary:     Effort: Pulmonary effort is normal.     Breath sounds: Normal breath sounds.     Comments: Lungs clear to auscultation Skin:    General: Skin is warm.  Neurological:     Mental Status: She is alert and oriented to person, place, and time.  Psychiatric:        Mood and Affect: Mood normal.        Behavior: Behavior normal.        Thought Content: Thought content normal.        Judgment: Judgment normal.     Diagnostics: FVC 4.51 L, FEV1 3.53 L.  Predicted FVC 3.15 L, FEV1 2.75 L.  Spirometry indicates normal ventilatory function.  Assessment and Plan: 1. Mild intermittent asthma without complication   2. Perennial and seasonal allergic rhinitis   3. Anaphylactic shock due to food, subsequent encounter   4. Pollen-food allergy, subsequent encounter   5. Allergic conjunctivitis of both eyes     Meds ordered this encounter  Medications   EPINEPHrine (AUVI-Q) 0.3 mg/0.3 mL IJ SOAJ injection    Sig: Use as directed for severe allergic reactions    Dispense:  2 each    Refill:  0   azelastine (ASTELIN) 0.1 % nasal spray    Sig: Use 1-2 sprays in each nostril one to two times a day as needed for runny nose or drainage    Dispense:  30 mL    Refill:  5    Patient Instructions  Perennial and seasonal allergic rhinitis Continue Zyrtec 10 mg once a day as needed for runny nose or itching. Start taking an antihistamine twice a day one day prior to allergy injections, the day of allergy injections, and the day after allergy injections. Caution as this can make you drowsy. Then on the other days go back to Zyrtec 10 mg once a day.  We will have the injection room back you down to 0.1 of 10,000 and follow schedule A. Continue fluticasone nasal spray 1-2 sprays each nostril once a  day as needed Start azelastine nasal spray 1-2 sprays each nostril up to 2 times a day as needed for runny nose or drainage down throat May use saline nasal spray or saline nasal rinse for nasal symptoms. Use this prior to any medicated nasal sprays.  Allergic conjunctivits May use Pataday 0.2% 1 drop each eye once a day as needed for itchy watery eyes  Food allergy Avoid celery, carrot, and cabbage. Also avoid any fresh raw fruits or vegetables which cause mouth or ear itching. In case of an allergic reaction, give  Benadryl 4 teaspoonfuls every 4 hours, and if life-threatening symptoms occur, inject with AuviQ 0.3 mg.  Mild intermittent asthma Continue albuterol 2 puffs every 4 hours as needed for cough, wheeze, tightness in chest, or shortness of breath  Hand dermatitis Continue daily moisturizing program  Please let us know if this treatment plan is not working well for you Schedule a follow up appointment in 3 months   Return in about 3 months (around 03/09/2020), or if symptoms worsen or fail to improve.    Thank you for the opportunity to care for this patient.  Please do not hesitate to contact me with questions.  Nehemiah Settle, FNP [facility]

## 2019-12-10 ENCOUNTER — Ambulatory Visit (INDEPENDENT_AMBULATORY_CARE_PROVIDER_SITE_OTHER): Payer: BC Managed Care – PPO | Admitting: *Deleted

## 2019-12-10 DIAGNOSIS — J309 Allergic rhinitis, unspecified: Secondary | ICD-10-CM | POA: Diagnosis not present

## 2019-12-17 ENCOUNTER — Ambulatory Visit (INDEPENDENT_AMBULATORY_CARE_PROVIDER_SITE_OTHER): Payer: BC Managed Care – PPO | Admitting: *Deleted

## 2019-12-17 DIAGNOSIS — J309 Allergic rhinitis, unspecified: Secondary | ICD-10-CM

## 2019-12-24 ENCOUNTER — Ambulatory Visit (INDEPENDENT_AMBULATORY_CARE_PROVIDER_SITE_OTHER): Payer: BC Managed Care – PPO

## 2019-12-24 DIAGNOSIS — J309 Allergic rhinitis, unspecified: Secondary | ICD-10-CM

## 2019-12-31 ENCOUNTER — Ambulatory Visit (INDEPENDENT_AMBULATORY_CARE_PROVIDER_SITE_OTHER): Payer: BC Managed Care – PPO

## 2019-12-31 DIAGNOSIS — J309 Allergic rhinitis, unspecified: Secondary | ICD-10-CM

## 2020-01-14 ENCOUNTER — Ambulatory Visit (INDEPENDENT_AMBULATORY_CARE_PROVIDER_SITE_OTHER): Payer: BC Managed Care – PPO

## 2020-01-14 DIAGNOSIS — J309 Allergic rhinitis, unspecified: Secondary | ICD-10-CM | POA: Diagnosis not present

## 2020-01-21 ENCOUNTER — Ambulatory Visit (INDEPENDENT_AMBULATORY_CARE_PROVIDER_SITE_OTHER): Payer: BC Managed Care – PPO

## 2020-01-21 DIAGNOSIS — J309 Allergic rhinitis, unspecified: Secondary | ICD-10-CM | POA: Diagnosis not present

## 2020-02-05 ENCOUNTER — Ambulatory Visit (INDEPENDENT_AMBULATORY_CARE_PROVIDER_SITE_OTHER): Payer: BC Managed Care – PPO

## 2020-02-05 DIAGNOSIS — J309 Allergic rhinitis, unspecified: Secondary | ICD-10-CM

## 2020-02-13 ENCOUNTER — Ambulatory Visit (INDEPENDENT_AMBULATORY_CARE_PROVIDER_SITE_OTHER): Payer: BC Managed Care – PPO

## 2020-02-13 DIAGNOSIS — J309 Allergic rhinitis, unspecified: Secondary | ICD-10-CM

## 2020-02-18 ENCOUNTER — Telehealth: Payer: Self-pay | Admitting: Family

## 2020-02-18 MED ORDER — ALBUTEROL SULFATE HFA 108 (90 BASE) MCG/ACT IN AERS: 2.0000 | INHALATION_SPRAY | Freq: Four times a day (QID) | RESPIRATORY_TRACT | 1 refills | Status: DC | PRN

## 2020-02-18 NOTE — Telephone Encounter (Signed)
Refill sent and patient informed.

## 2020-02-18 NOTE — Telephone Encounter (Signed)
Patient called and needs a refill on her proair. [facility] [address] .[phone].

## 2020-02-23 ENCOUNTER — Ambulatory Visit (INDEPENDENT_AMBULATORY_CARE_PROVIDER_SITE_OTHER): Payer: BC Managed Care – PPO | Admitting: *Deleted

## 2020-02-23 DIAGNOSIS — J309 Allergic rhinitis, unspecified: Secondary | ICD-10-CM | POA: Diagnosis not present

## 2020-03-05 ENCOUNTER — Ambulatory Visit (INDEPENDENT_AMBULATORY_CARE_PROVIDER_SITE_OTHER): Payer: BC Managed Care – PPO

## 2020-03-05 DIAGNOSIS — J309 Allergic rhinitis, unspecified: Secondary | ICD-10-CM

## 2020-03-08 ENCOUNTER — Ambulatory Visit (INDEPENDENT_AMBULATORY_CARE_PROVIDER_SITE_OTHER): Payer: BC Managed Care – PPO

## 2020-03-08 DIAGNOSIS — J309 Allergic rhinitis, unspecified: Secondary | ICD-10-CM | POA: Diagnosis not present

## 2020-03-24 ENCOUNTER — Ambulatory Visit (INDEPENDENT_AMBULATORY_CARE_PROVIDER_SITE_OTHER): Payer: BC Managed Care – PPO

## 2020-03-24 DIAGNOSIS — J309 Allergic rhinitis, unspecified: Secondary | ICD-10-CM | POA: Diagnosis not present

## 2020-04-02 ENCOUNTER — Ambulatory Visit (INDEPENDENT_AMBULATORY_CARE_PROVIDER_SITE_OTHER): Payer: BC Managed Care – PPO | Admitting: *Deleted

## 2020-04-02 DIAGNOSIS — J309 Allergic rhinitis, unspecified: Secondary | ICD-10-CM | POA: Diagnosis not present

## 2020-04-09 ENCOUNTER — Ambulatory Visit (INDEPENDENT_AMBULATORY_CARE_PROVIDER_SITE_OTHER): Payer: BC Managed Care – PPO | Admitting: *Deleted

## 2020-04-09 DIAGNOSIS — J309 Allergic rhinitis, unspecified: Secondary | ICD-10-CM | POA: Diagnosis not present

## 2020-04-16 ENCOUNTER — Ambulatory Visit (INDEPENDENT_AMBULATORY_CARE_PROVIDER_SITE_OTHER): Payer: BC Managed Care – PPO

## 2020-04-16 DIAGNOSIS — J309 Allergic rhinitis, unspecified: Secondary | ICD-10-CM

## 2020-04-23 ENCOUNTER — Ambulatory Visit (INDEPENDENT_AMBULATORY_CARE_PROVIDER_SITE_OTHER): Payer: BC Managed Care – PPO

## 2020-04-23 DIAGNOSIS — J309 Allergic rhinitis, unspecified: Secondary | ICD-10-CM | POA: Diagnosis not present

## 2020-05-05 ENCOUNTER — Ambulatory Visit (INDEPENDENT_AMBULATORY_CARE_PROVIDER_SITE_OTHER): Payer: BC Managed Care – PPO

## 2020-05-05 DIAGNOSIS — J309 Allergic rhinitis, unspecified: Secondary | ICD-10-CM | POA: Diagnosis not present

## 2020-05-14 ENCOUNTER — Ambulatory Visit (INDEPENDENT_AMBULATORY_CARE_PROVIDER_SITE_OTHER): Payer: BC Managed Care – PPO

## 2020-05-14 DIAGNOSIS — J309 Allergic rhinitis, unspecified: Secondary | ICD-10-CM | POA: Diagnosis not present

## 2020-05-21 ENCOUNTER — Ambulatory Visit (INDEPENDENT_AMBULATORY_CARE_PROVIDER_SITE_OTHER): Payer: BC Managed Care – PPO

## 2020-05-21 DIAGNOSIS — J309 Allergic rhinitis, unspecified: Secondary | ICD-10-CM

## 2020-05-28 ENCOUNTER — Ambulatory Visit (INDEPENDENT_AMBULATORY_CARE_PROVIDER_SITE_OTHER): Payer: BC Managed Care – PPO

## 2020-05-28 DIAGNOSIS — J309 Allergic rhinitis, unspecified: Secondary | ICD-10-CM

## 2020-06-16 ENCOUNTER — Ambulatory Visit (INDEPENDENT_AMBULATORY_CARE_PROVIDER_SITE_OTHER): Payer: BC Managed Care – PPO

## 2020-06-16 DIAGNOSIS — J309 Allergic rhinitis, unspecified: Secondary | ICD-10-CM

## 2020-07-01 ENCOUNTER — Ambulatory Visit (INDEPENDENT_AMBULATORY_CARE_PROVIDER_SITE_OTHER): Payer: BC Managed Care – PPO | Admitting: *Deleted

## 2020-07-01 DIAGNOSIS — J309 Allergic rhinitis, unspecified: Secondary | ICD-10-CM

## 2020-07-08 ENCOUNTER — Ambulatory Visit (INDEPENDENT_AMBULATORY_CARE_PROVIDER_SITE_OTHER): Payer: BC Managed Care – PPO | Admitting: *Deleted

## 2020-07-08 DIAGNOSIS — J309 Allergic rhinitis, unspecified: Secondary | ICD-10-CM

## 2020-07-20 ENCOUNTER — Ambulatory Visit (INDEPENDENT_AMBULATORY_CARE_PROVIDER_SITE_OTHER): Payer: BC Managed Care – PPO | Admitting: *Deleted

## 2020-07-20 DIAGNOSIS — J309 Allergic rhinitis, unspecified: Secondary | ICD-10-CM | POA: Diagnosis not present

## 2020-07-29 ENCOUNTER — Ambulatory Visit (INDEPENDENT_AMBULATORY_CARE_PROVIDER_SITE_OTHER): Payer: BC Managed Care – PPO | Admitting: *Deleted

## 2020-07-29 DIAGNOSIS — J309 Allergic rhinitis, unspecified: Secondary | ICD-10-CM | POA: Diagnosis not present

## 2020-08-12 ENCOUNTER — Ambulatory Visit (INDEPENDENT_AMBULATORY_CARE_PROVIDER_SITE_OTHER): Payer: BC Managed Care – PPO | Admitting: *Deleted

## 2020-08-12 DIAGNOSIS — J309 Allergic rhinitis, unspecified: Secondary | ICD-10-CM | POA: Diagnosis not present

## 2020-08-24 ENCOUNTER — Ambulatory Visit (INDEPENDENT_AMBULATORY_CARE_PROVIDER_SITE_OTHER): Payer: BC Managed Care – PPO | Admitting: *Deleted

## 2020-08-24 DIAGNOSIS — J309 Allergic rhinitis, unspecified: Secondary | ICD-10-CM

## 2020-09-06 ENCOUNTER — Ambulatory Visit (INDEPENDENT_AMBULATORY_CARE_PROVIDER_SITE_OTHER): Payer: BC Managed Care – PPO | Admitting: *Deleted

## 2020-09-06 DIAGNOSIS — J309 Allergic rhinitis, unspecified: Secondary | ICD-10-CM | POA: Diagnosis not present

## 2020-09-16 ENCOUNTER — Ambulatory Visit (INDEPENDENT_AMBULATORY_CARE_PROVIDER_SITE_OTHER): Payer: BC Managed Care – PPO | Admitting: *Deleted

## 2020-09-16 DIAGNOSIS — J309 Allergic rhinitis, unspecified: Secondary | ICD-10-CM

## 2020-09-23 ENCOUNTER — Ambulatory Visit (INDEPENDENT_AMBULATORY_CARE_PROVIDER_SITE_OTHER): Payer: BC Managed Care – PPO

## 2020-09-23 DIAGNOSIS — J309 Allergic rhinitis, unspecified: Secondary | ICD-10-CM

## 2020-09-30 ENCOUNTER — Ambulatory Visit (INDEPENDENT_AMBULATORY_CARE_PROVIDER_SITE_OTHER): Payer: BC Managed Care – PPO | Admitting: *Deleted

## 2020-09-30 ENCOUNTER — Telehealth: Payer: Self-pay | Admitting: *Deleted

## 2020-09-30 DIAGNOSIS — J309 Allergic rhinitis, unspecified: Secondary | ICD-10-CM

## 2020-09-30 NOTE — Telephone Encounter (Signed)
Patient received 0.40mL of her first Red vials today and experienced some local swelling in the LUA from her G-WEEDS-T-DM vial at a 4+ local reaction. She stated that she also had a feeling of itchiness in her throat and was attempting to clear her throat. Patient was brought back and examined by Dr. Dellis Anes and was given 20mg  of Cetirizine. Her vitals were monitored and she left at 3:07pm in stable condition. Attempted to call patient this evening to see how she was doing but there was no answer, left a voicemail asking for patient to return call. She did stated that she pre medicates with Zyrtec the night before and morning of her injections. Please advise for next dosing for the patient.

## 2020-09-30 NOTE — Progress Notes (Signed)
Immunotherapy   Patient Details  Name: Teesha Ohm MRN: [id number] Date of Birth: 1991/01/02  09/30/2020  Rigoberto Noel  Patient received 0.56mL of her first Red vials today and experienced some local swelling in the LUA from her G-WEEDS-T-DM vial at a 4+ local reaction. She stated that she also had a feeling of itchiness in her throat and was attempting to clear her throat. Patient was brought back and examined by Dr. Dellis Anes and was given 20mg  of Cetirizine. Her vitals were monitored and she left at 3:07pm in stable condition.

## 2020-10-01 NOTE — Telephone Encounter (Signed)
Called and spoke with patient and she states that she is doing much better today. She states that she does have some soreness in the back of her throat but that it is also from the changing of the seasons. She states that the local reaction on her arm is now gone. I advised of Chrissie's plan. Patient verbalized understanding and was agreeable to plan.

## 2020-10-01 NOTE — Telephone Encounter (Signed)
Please back her down to 0.45 in her green vial. Have her continue to take Zyrtec the night before and the morning of her injections. We can consider using Epi rinse if she continues to have problems with large local reactions.

## 2020-10-05 DIAGNOSIS — J3089 Other allergic rhinitis: Secondary | ICD-10-CM

## 2020-10-05 NOTE — Progress Notes (Signed)
VIAL MADE. EXP 10-05-21 

## 2020-10-07 ENCOUNTER — Ambulatory Visit (INDEPENDENT_AMBULATORY_CARE_PROVIDER_SITE_OTHER): Payer: BC Managed Care – PPO | Admitting: *Deleted

## 2020-10-07 DIAGNOSIS — J309 Allergic rhinitis, unspecified: Secondary | ICD-10-CM

## 2020-10-19 ENCOUNTER — Ambulatory Visit (INDEPENDENT_AMBULATORY_CARE_PROVIDER_SITE_OTHER): Payer: BC Managed Care – PPO | Admitting: *Deleted

## 2020-10-19 DIAGNOSIS — J309 Allergic rhinitis, unspecified: Secondary | ICD-10-CM

## 2020-10-28 ENCOUNTER — Ambulatory Visit (INDEPENDENT_AMBULATORY_CARE_PROVIDER_SITE_OTHER): Payer: BC Managed Care – PPO | Admitting: *Deleted

## 2020-10-28 DIAGNOSIS — J309 Allergic rhinitis, unspecified: Secondary | ICD-10-CM

## 2020-11-01 ENCOUNTER — Other Ambulatory Visit: Payer: Self-pay

## 2020-11-01 MED ORDER — ALBUTEROL SULFATE HFA 108 (90 BASE) MCG/ACT IN AERS: 2.0000 | INHALATION_SPRAY | Freq: Four times a day (QID) | RESPIRATORY_TRACT | 1 refills | Status: DC | PRN

## 2020-11-02 ENCOUNTER — Ambulatory Visit (INDEPENDENT_AMBULATORY_CARE_PROVIDER_SITE_OTHER): Payer: BC Managed Care – PPO

## 2020-11-02 DIAGNOSIS — J309 Allergic rhinitis, unspecified: Secondary | ICD-10-CM | POA: Diagnosis not present

## 2020-11-09 ENCOUNTER — Ambulatory Visit (INDEPENDENT_AMBULATORY_CARE_PROVIDER_SITE_OTHER): Payer: BC Managed Care – PPO

## 2020-11-09 DIAGNOSIS — J309 Allergic rhinitis, unspecified: Secondary | ICD-10-CM

## 2020-11-18 ENCOUNTER — Ambulatory Visit (INDEPENDENT_AMBULATORY_CARE_PROVIDER_SITE_OTHER): Payer: BC Managed Care – PPO | Admitting: *Deleted

## 2020-11-18 DIAGNOSIS — J309 Allergic rhinitis, unspecified: Secondary | ICD-10-CM

## 2020-11-30 ENCOUNTER — Ambulatory Visit (INDEPENDENT_AMBULATORY_CARE_PROVIDER_SITE_OTHER): Payer: BC Managed Care – PPO | Admitting: *Deleted

## 2020-11-30 DIAGNOSIS — J309 Allergic rhinitis, unspecified: Secondary | ICD-10-CM

## 2020-12-11 NOTE — Patient Instructions (Addendum)
Perennial and seasonal allergic rhinitis Continue Zyrtec 10 mg once a day as needed for runny nose or itching. Start taking an antihistamine twice a day one day prior to allergy injections, the day of allergy injections, and the day after allergy injections. Caution as this can make you drowsy. Then on the other days go back to Zyrtec 10 mg once a day.  Continue fluticasone nasal spray 1-2 sprays each nostril once a day as needed Continue azelastine nasal spray 1-2 sprays each nostril up to 2 times a day as needed for runny nose or drainage down throat May use saline nasal spray or saline nasal rinse for nasal symptoms. Use this prior to any medicated nasal sprays.  Allergic conjunctivits May use Pataday 0.2% 1 drop each eye once a day as needed for itchy watery eyes  Food allergy Avoid shellfish,fish, celery, carrot, and cabbage. Also avoid any fresh raw fruits or vegetables which cause mouth or ear itching. In case of an allergic reaction, give Benadryl 4 teaspoonfuls every 4 hours, and if life-threatening symptoms occur, inject with EpiPen 0.3 mg. We will get blood work to follow-up on your shellfish and fish allergy since you have not been off all antihistamines.  We will call you with results once they are back  Mild intermittent asthma Continue albuterol 2 puffs every 4 hours as needed for cough, wheeze, tightness in chest, or shortness of breath  Hand dermatitis Continue daily moisturizing program  Please let us know if this treatment plan is not working well for you Schedule a follow up appointment in 6-12 months

## 2020-12-13 ENCOUNTER — Encounter: Payer: Self-pay | Admitting: Family

## 2020-12-13 ENCOUNTER — Ambulatory Visit: Payer: BC Managed Care – PPO | Admitting: Family

## 2020-12-13 ENCOUNTER — Other Ambulatory Visit: Payer: Self-pay

## 2020-12-13 VITALS — BP 124/80 | HR 80 | Temp 98.7°F | Resp 16 | Ht 61.54 in | Wt 177.8 lb

## 2020-12-13 DIAGNOSIS — T781XXD Other adverse food reactions, not elsewhere classified, subsequent encounter: Secondary | ICD-10-CM

## 2020-12-13 DIAGNOSIS — J4520 Mild intermittent asthma, uncomplicated: Secondary | ICD-10-CM

## 2020-12-13 DIAGNOSIS — T7800XD Anaphylactic reaction due to unspecified food, subsequent encounter: Secondary | ICD-10-CM

## 2020-12-13 DIAGNOSIS — J3089 Other allergic rhinitis: Secondary | ICD-10-CM

## 2020-12-13 DIAGNOSIS — H1013 Acute atopic conjunctivitis, bilateral: Secondary | ICD-10-CM | POA: Diagnosis not present

## 2020-12-13 MED ORDER — ALBUTEROL SULFATE HFA 108 (90 BASE) MCG/ACT IN AERS: 2.0000 | INHALATION_SPRAY | RESPIRATORY_TRACT | 1 refills | Status: DC | PRN

## 2020-12-13 MED ORDER — EPINEPHRINE 0.3 MG/0.3ML IJ SOAJ: 0.3000 mg | INTRAMUSCULAR | 1 refills | Status: DC | PRN

## 2020-12-13 MED ORDER — CETIRIZINE HCL 10 MG PO TABS
10.0000 mg | ORAL_TABLET | Freq: Every day | ORAL | 5 refills | Status: DC
Start: 2020-12-13 — End: 2021-06-09

## 2020-12-13 MED ORDER — FLUTICASONE PROPIONATE 50 MCG/ACT NA SUSP
NASAL | 5 refills | Status: DC
Start: 2020-12-13 — End: 2022-05-01

## 2020-12-13 MED ORDER — AZELASTINE HCL 0.1 % NA SOLN
NASAL | 5 refills | Status: DC
Start: 2020-12-13 — End: 2021-06-09

## 2020-12-13 NOTE — Progress Notes (Signed)
[address] [place] [address] Dept: [phone]  FOLLOW UP NOTE  Patient ID: Joy Wade, female    DOB: July 11, 1990  Age: 30 y.o. MRN: [id number] Date of Office Visit: 12/13/2020  Assessment  Chief Complaint: Asthma (ACT 20. One flare up in the summer (June/July) had to go to urgent care to get a breathing treatment. Less sob moments patient thinks it is due to new blood pressure medication.) and Allergy Testing (Wants to get tested for shellfish because she has had a reaction to it lately.)  HPI Joy Wade is a 30 year old female who presents today for follow-up of mild intermittent asthma without complication, perennial and seasonal allergic rhinitis, anaphylactic shock due to food, pollen food allergy, and allergic conjunctivitis.  She was last seen on December 08, 2019 by Nehemiah Settle, FNP.  Perennial and seasonal allergic rhinitis is reported as moderately controlled with Zyrtec 10 mg twice a day 1 day prior to allergy injections, the day of allergy injections, and the day after allergy injections.  The other days she all take her Zyrtec mainly 10 mg once a day.  She also uses azelastine nasal spray and saline rinse as needed and has not been using fluticasone nasal spray.  She does feel like her allergy injections are helping and she has not had any large local reactions at the injection site since September or October.  She can tell a difference with her allergy injections as she is now able to eat cantaloupe and honeydew without any problems.  She is also able to eat some vegetables.  For example she mentions she can eat celery in chicken salad, but will still have irritation if she eats celery by itself.  She has not tried carrots or cabbage.  Allergic conjunctivitis is reported as moderately controlled with Pataday eyedrops as needed.  She reports occasional itchy watery eyes for which Pataday eyedrops help.  She continues to avoid foods that bother her  and has not had to use her EpiPen..  She would like to be tested for fish and shellfish.  She noticed this March while she was at work someone had shellfish and the smell in the room caused her throat to be itchy and her eyes were itchy.  She took Benadryl and drank a bottle of water and this helped.  She denied any concomitant cardiorespiratory and gastrointestinal symptoms.  Also at this past July while at a beach trip someone picked up popcorn or broiled shrimp and it caused these same symptoms.  She has not had shellfish to eat since around 2014 or 2015.  She mentions that she is not certain if the symptoms are due to anxiety.  She does not eat fish and would like to be checked for this.  Mild intermittent asthma is reported as moderately controlled with albuterol as needed.  She reports this past week she was sick with a cold.  She has a residual cough.  She also mentions back in June she went to urgent care due to her asthma flaring and was giving a nebulizer treatment and this helped.  She reports occasional dry cough and denies wheezing, tightness in her chest, shortness of breath, and nocturnal awakenings.  Since her last office visit she has not required any systemic steroids due to breathing problems.  She uses her inhaler maybe once a week.  Hand dermatitis is reported as a lot better with triamcinolone 0.1% cream, Aquaphor, and Jojoba oil   Drug Allergies:  Allergies  Allergen  Reactions   Amoxicillin    Erythromycin Base Dermatitis   Other     amoxicillan   Sulfa Antibiotics    Sulfamethoxazole Dermatitis    Review of Systems: Review of Systems  Constitutional:  Negative for chills and fever.  Eyes:        Reports itchy watery eyes at times for which olopatadine eyedrops help  Respiratory:  Positive for cough. Negative for shortness of breath and wheezing.        Reports dry cough at times since being sick last week.  Denies wheezing, tightness in her chest, and shortness of  breath  Cardiovascular:  Negative for chest pain and palpitations.       Reports that she is no longer having the sensation of her heart racing and pounding since starting losartan in October  Gastrointestinal:        Reports heartburn/reflux symptoms if she eats past 8:00 pm  Genitourinary:  Negative for frequency.  Skin:  Negative for itching and rash.  Neurological:  Positive for headaches.       Reports headaches at times  Endo/Heme/Allergies:  Positive for environmental allergies.    Physical Exam: BP 124/80   Pulse 80   Temp 98.7 F (37.1 C) (Temporal)   Resp 16   Ht 5' 1.54" (1.563 m)   Wt 177 lb 12.8 oz (80.6 kg)   BMI 33.01 kg/m    Physical Exam Constitutional:      Appearance: Normal appearance.  HENT:     Head: Normocephalic and atraumatic.     Comments: Pharynx normal, eyes normal, ears normal, nose normal    Right Ear: Tympanic membrane, ear canal and external ear normal.     Left Ear: Tympanic membrane, ear canal and external ear normal.     Nose: Nose normal.     Mouth/Throat:     Mouth: Mucous membranes are moist.     Pharynx: Oropharynx is clear.  Eyes:     Conjunctiva/sclera: Conjunctivae normal.  Cardiovascular:     Rate and Rhythm: Regular rhythm.     Heart sounds: Normal heart sounds.  Pulmonary:     Effort: Pulmonary effort is normal.     Breath sounds: Normal breath sounds.     Comments: Lungs clear to auscultation Musculoskeletal:     Cervical back: Neck supple.  Skin:    General: Skin is warm.  Neurological:     Mental Status: She is alert and oriented to person, place, and time.  Psychiatric:        Mood and Affect: Mood normal.        Behavior: Behavior normal.        Thought Content: Thought content normal.        Judgment: Judgment normal.    Diagnostics: FVC 3.70 L, FEV1 2.96 L.  Predicted FVC 3.41 L, predicted FEV1 2.91 L.  Spirometry indicates normal respiratory function.  Assessment and Plan: 1. Mild intermittent asthma  without complication   2. Anaphylactic shock due to food, subsequent encounter   3. Perennial and seasonal allergic rhinitis   4. Allergic conjunctivitis of both eyes   5. Pollen-food allergy, subsequent encounter     Meds ordered this encounter  Medications   azelastine (ASTELIN) 0.1 % nasal spray    Sig: Use 1-2 sprays in each nostril one to two times a day as needed for runny nose or drainage    Dispense:  30 mL    Refill:  5   cetirizine (ZYRTEC)  10 MG tablet    Sig: Take 1 tablet (10 mg total) by mouth daily.    Dispense:  30 tablet    Refill:  5   fluticasone (FLONASE) 50 MCG/ACT nasal spray    Sig: Place 2 sprays each nostril once a day as needed for stuffy nose    Dispense:  18.2 g    Refill:  5   EPINEPHrine (EPIPEN 2-PAK) 0.3 mg/0.3 mL IJ SOAJ injection    Sig: Inject 0.3 mg into the muscle as needed for anaphylaxis.    Dispense:  2 each    Refill:  1   albuterol (VENTOLIN HFA) 108 (90 Base) MCG/ACT inhaler    Sig: Inhale 2 puffs into the lungs every 4 (four) hours as needed for wheezing or shortness of breath.    Dispense:  18 g    Refill:  1     Patient Instructions  Perennial and seasonal allergic rhinitis Continue Zyrtec 10 mg once a day as needed for runny nose or itching. Start taking an antihistamine twice a day one day prior to allergy injections, the day of allergy injections, and the day after allergy injections. Caution as this can make you drowsy. Then on the other days go back to Zyrtec 10 mg once a day.  Continue fluticasone nasal spray 1-2 sprays each nostril once a day as needed Continue azelastine nasal spray 1-2 sprays each nostril up to 2 times a day as needed for runny nose or drainage down throat May use saline nasal spray or saline nasal rinse for nasal symptoms. Use this prior to any medicated nasal sprays.  Allergic conjunctivits May use Pataday 0.2% 1 drop each eye once a day as needed for itchy watery eyes  Food allergy Avoid  shellfish,fish, celery, carrot, and cabbage. Also avoid any fresh raw fruits or vegetables which cause mouth or ear itching. In case of an allergic reaction, give Benadryl 4 teaspoonfuls every 4 hours, and if life-threatening symptoms occur, inject with EpiPen 0.3 mg. We will get blood work to follow-up on your shellfish and fish allergy since you have not been off all antihistamines.  We will call you with results once they are back  Mild intermittent asthma Continue albuterol 2 puffs every 4 hours as needed for cough, wheeze, tightness in chest, or shortness of breath  Hand dermatitis Continue daily moisturizing program  Please let us know if this treatment plan is not working well for you Schedule a follow up appointment in 6-12 months Return in about 6 months (around 06/12/2021), or if symptoms worsen or fail to improve.    Thank you for the opportunity to care for this patient.  Please do not hesitate to contact me with questions.  Nehemiah Settle, FNP [facility]

## 2020-12-16 ENCOUNTER — Ambulatory Visit (INDEPENDENT_AMBULATORY_CARE_PROVIDER_SITE_OTHER): Payer: BC Managed Care – PPO | Admitting: *Deleted

## 2020-12-16 DIAGNOSIS — J309 Allergic rhinitis, unspecified: Secondary | ICD-10-CM

## 2020-12-17 LAB — ALLERGEN PROFILE, SHELLFISH
Clam IgE: <0.1 kU/L
F023-IgE Crab: 0.18 kU/L — AB
F080-IgE Lobster: 0.18 kU/L — AB
F290-IgE Oyster: <0.1 kU/L
Scallop IgE: <0.1 kU/L
Shrimp IgE: 0.21 kU/L — AB

## 2020-12-17 LAB — ALLERGEN PROFILE, FOOD-FISH
Allergen Mackerel IgE: <0.1 kU/L
Allergen Salmon IgE: <0.1 kU/L
Allergen Trout IgE: <0.1 kU/L
Allergen Walley Pike IgE: <0.1 kU/L
Codfish IgE: <0.1 kU/L
Halibut IgE: <0.1 kU/L
Tuna: <0.1 kU/L

## 2020-12-17 NOTE — Progress Notes (Signed)
Please let Joy Wade know that her lab work to shellfish (crab, shrimp and lobster) were slightly elevated. I would like for her to continue to avoid all shellfish for know due to her dust mite allergy and the symptoms she had. Please make sure that she has an epinephrine auto injector device and that it is up to date.  Her lab work for fish was negative. When I last saw her she reports that she does not eat fish. If she is interested can eat fin fish.  Thank you, Nehemiah Settle, FNP

## 2020-12-23 ENCOUNTER — Ambulatory Visit (INDEPENDENT_AMBULATORY_CARE_PROVIDER_SITE_OTHER): Payer: BC Managed Care – PPO

## 2020-12-23 DIAGNOSIS — J309 Allergic rhinitis, unspecified: Secondary | ICD-10-CM

## 2021-01-04 ENCOUNTER — Ambulatory Visit (INDEPENDENT_AMBULATORY_CARE_PROVIDER_SITE_OTHER): Payer: BC Managed Care – PPO | Admitting: *Deleted

## 2021-01-04 DIAGNOSIS — J309 Allergic rhinitis, unspecified: Secondary | ICD-10-CM

## 2021-01-13 ENCOUNTER — Ambulatory Visit (INDEPENDENT_AMBULATORY_CARE_PROVIDER_SITE_OTHER): Payer: BC Managed Care – PPO | Admitting: *Deleted

## 2021-01-13 DIAGNOSIS — J309 Allergic rhinitis, unspecified: Secondary | ICD-10-CM | POA: Diagnosis not present

## 2021-01-20 ENCOUNTER — Ambulatory Visit (INDEPENDENT_AMBULATORY_CARE_PROVIDER_SITE_OTHER): Payer: BC Managed Care – PPO

## 2021-01-20 DIAGNOSIS — J309 Allergic rhinitis, unspecified: Secondary | ICD-10-CM

## 2021-01-27 ENCOUNTER — Ambulatory Visit (INDEPENDENT_AMBULATORY_CARE_PROVIDER_SITE_OTHER): Payer: BC Managed Care – PPO

## 2021-01-27 DIAGNOSIS — J309 Allergic rhinitis, unspecified: Secondary | ICD-10-CM | POA: Diagnosis not present

## 2021-02-08 ENCOUNTER — Ambulatory Visit (INDEPENDENT_AMBULATORY_CARE_PROVIDER_SITE_OTHER): Payer: BC Managed Care – PPO

## 2021-02-08 DIAGNOSIS — J309 Allergic rhinitis, unspecified: Secondary | ICD-10-CM | POA: Diagnosis not present

## 2021-02-24 ENCOUNTER — Ambulatory Visit (INDEPENDENT_AMBULATORY_CARE_PROVIDER_SITE_OTHER): Payer: BC Managed Care – PPO

## 2021-02-24 DIAGNOSIS — J309 Allergic rhinitis, unspecified: Secondary | ICD-10-CM

## 2021-03-08 ENCOUNTER — Ambulatory Visit (INDEPENDENT_AMBULATORY_CARE_PROVIDER_SITE_OTHER): Payer: BC Managed Care – PPO

## 2021-03-08 DIAGNOSIS — J309 Allergic rhinitis, unspecified: Secondary | ICD-10-CM

## 2021-03-15 ENCOUNTER — Ambulatory Visit (INDEPENDENT_AMBULATORY_CARE_PROVIDER_SITE_OTHER): Payer: BC Managed Care – PPO

## 2021-03-15 DIAGNOSIS — J309 Allergic rhinitis, unspecified: Secondary | ICD-10-CM

## 2021-03-22 ENCOUNTER — Ambulatory Visit (INDEPENDENT_AMBULATORY_CARE_PROVIDER_SITE_OTHER): Payer: BC Managed Care – PPO | Admitting: *Deleted

## 2021-03-22 DIAGNOSIS — J309 Allergic rhinitis, unspecified: Secondary | ICD-10-CM | POA: Diagnosis not present

## 2021-03-30 IMAGING — CT CT RENAL STONE PROTOCOL
2 of 4 series · 17 of 46 positions shown, 19 images · non-contrast
Comparison: None.

CLINICAL DATA: Nausea vomiting and flank pain

EXAM:
CT ABDOMEN AND PELVIS WITHOUT CONTRAST
TECHNIQUE: Multidetector CT imaging of the abdomen and pelvis was performed
following the standard protocol without IV contrast.

[Series 2: axial st · axial · 0.79mm/px · z∈[-634,-238]mm · 14 of 91 slices shown, 16 images]
[im 6/91  soft-tissue]
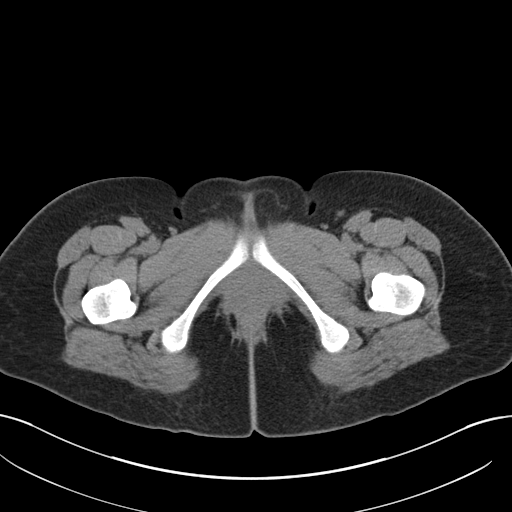
[im 6/91  bone]
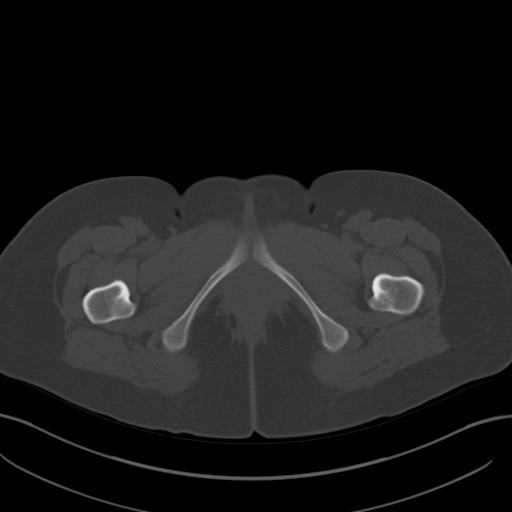
[im 11/91  soft-tissue]
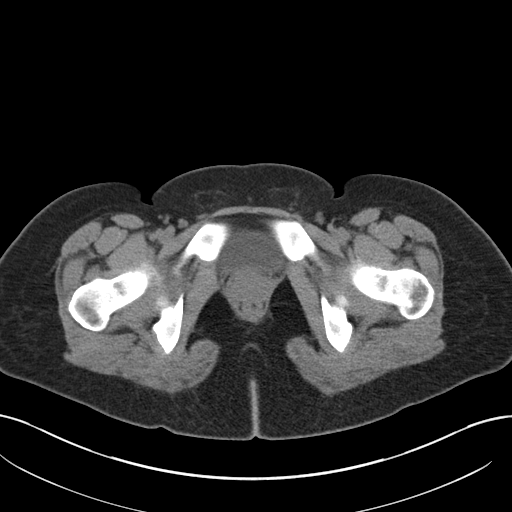
[im 16/91  soft-tissue]
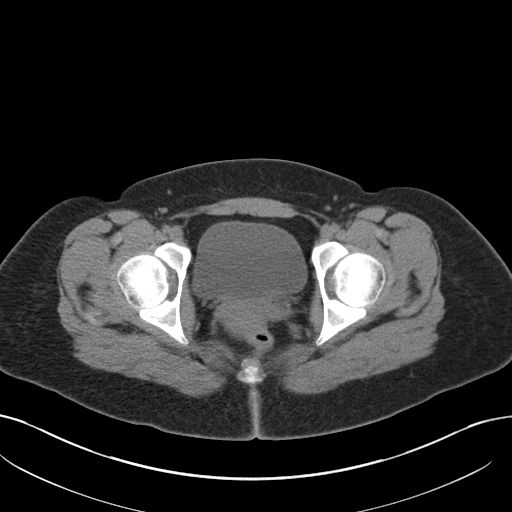
[im 27/91  soft-tissue]
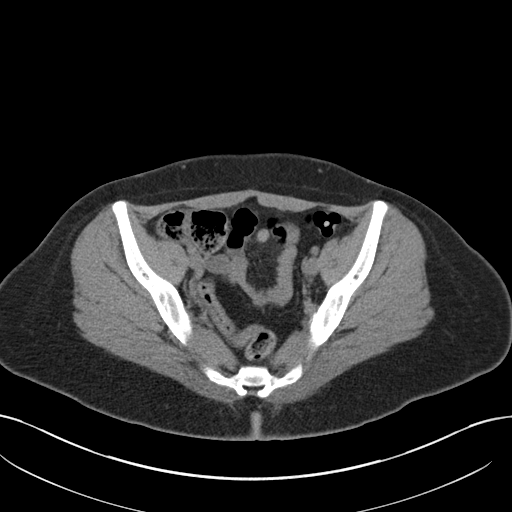
[im 32/91  soft-tissue]
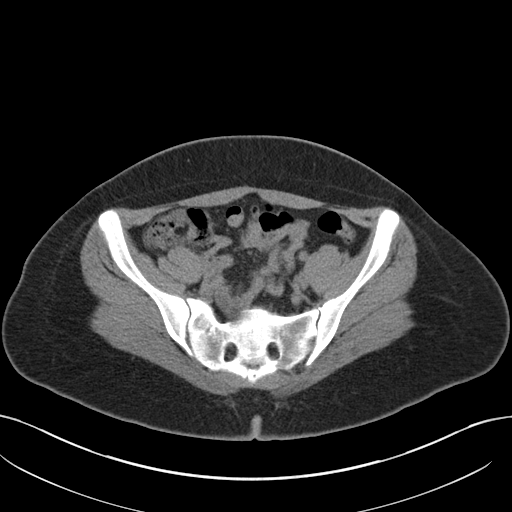
[im 38/91  soft-tissue]
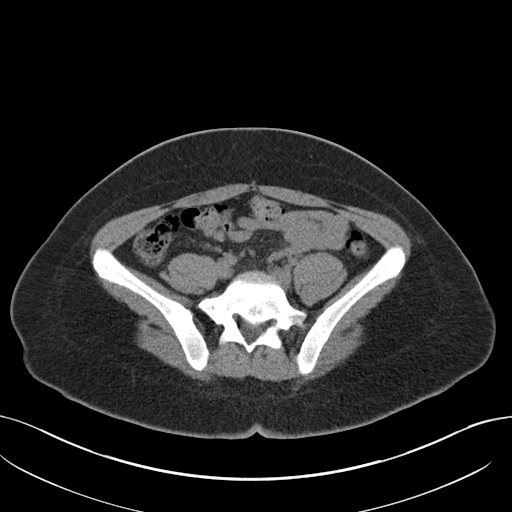
[im 43/91  soft-tissue]
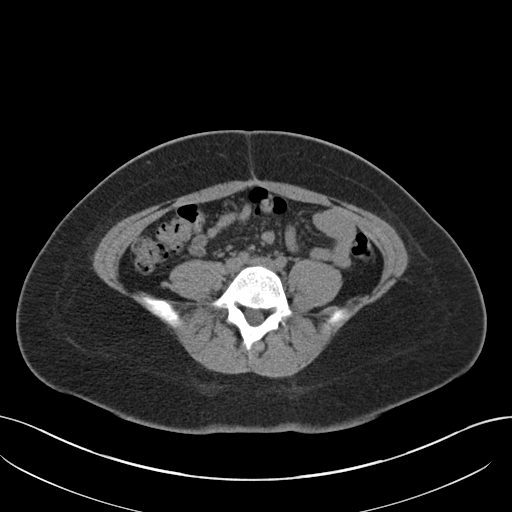
[im 48/91  soft-tissue]
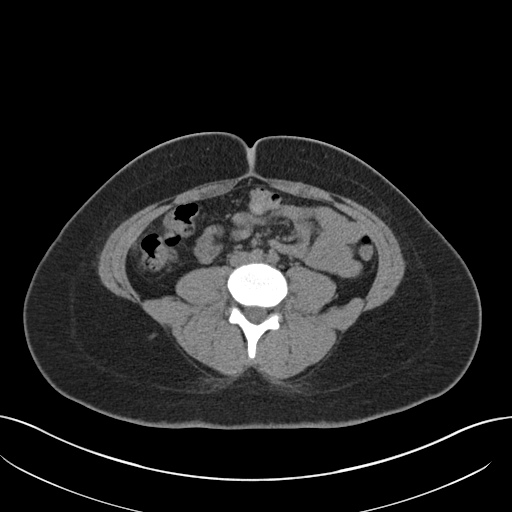
[im 53/91  soft-tissue]
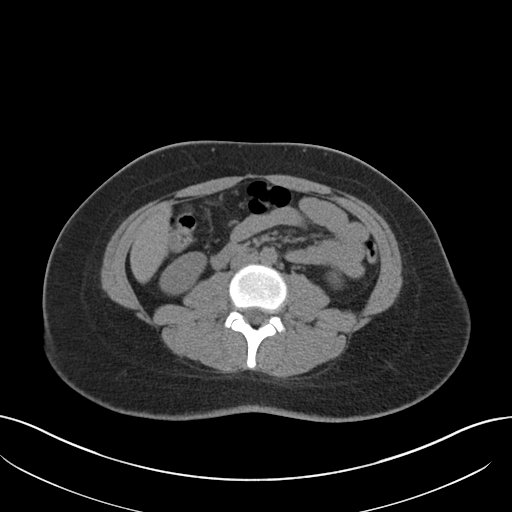
[im 53/91  bone]
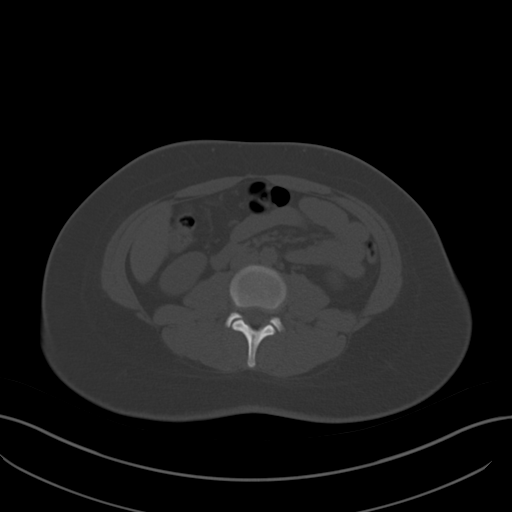
[im 59/91  soft-tissue]
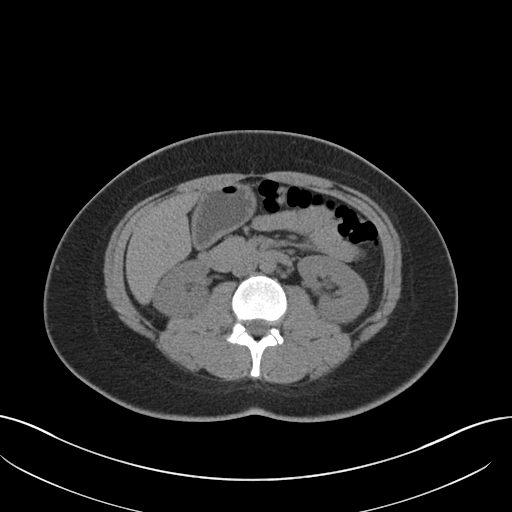
[im 69/91  soft-tissue]
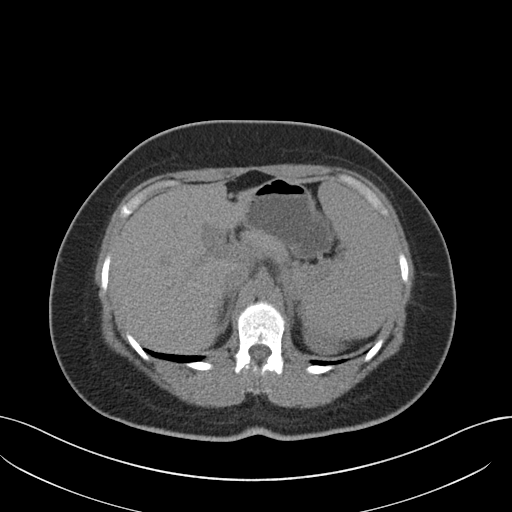
[im 75/91  soft-tissue]
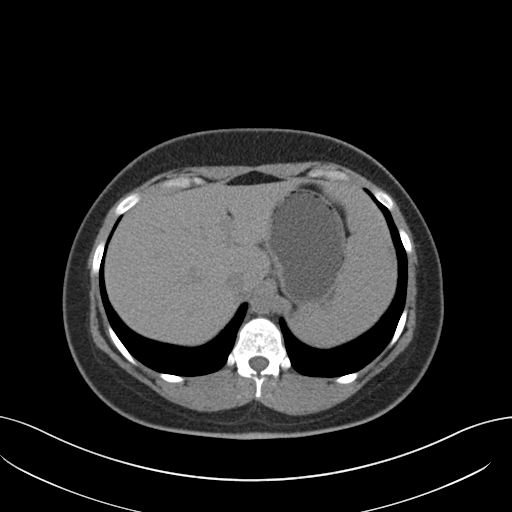
[im 80/91  soft-tissue]
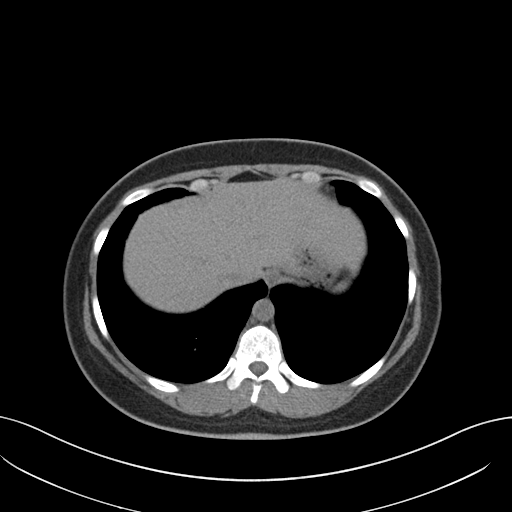
[im 85/91  soft-tissue]
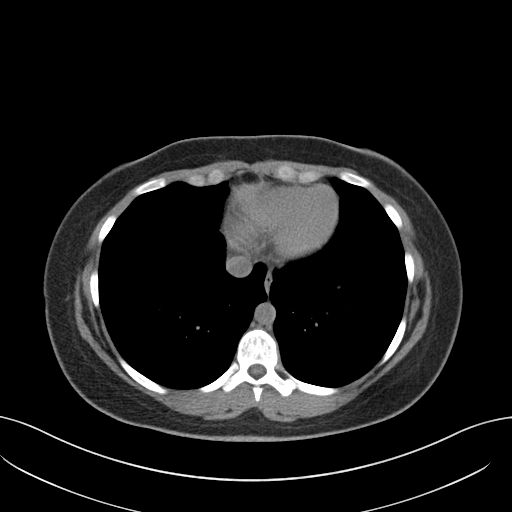

[Series 4: coronal · coronal · 0.81mm/px · 3 of 130 slices shown]
[im 44/130  soft-tissue]
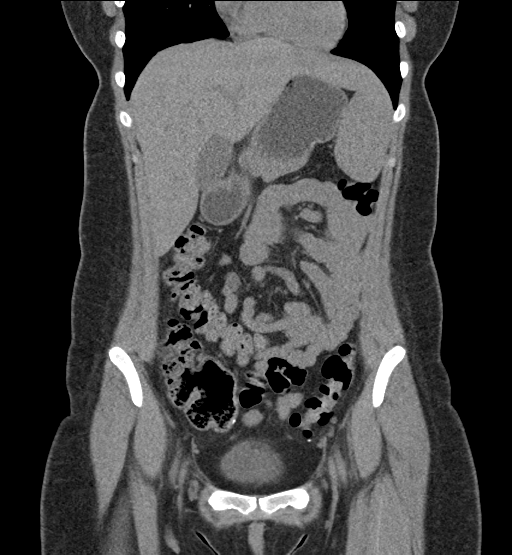
[im 58/130  soft-tissue]
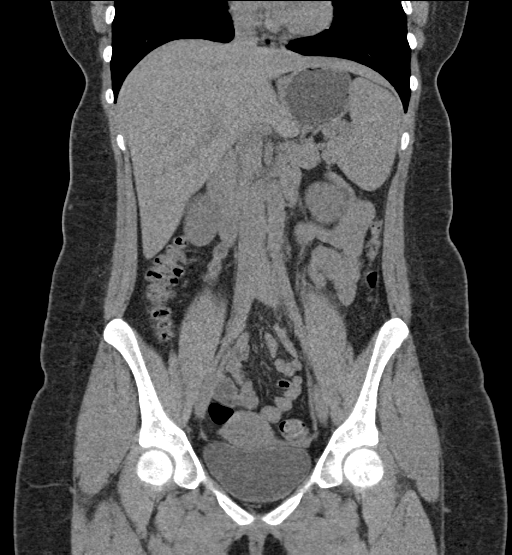
[im 72/130  soft-tissue]
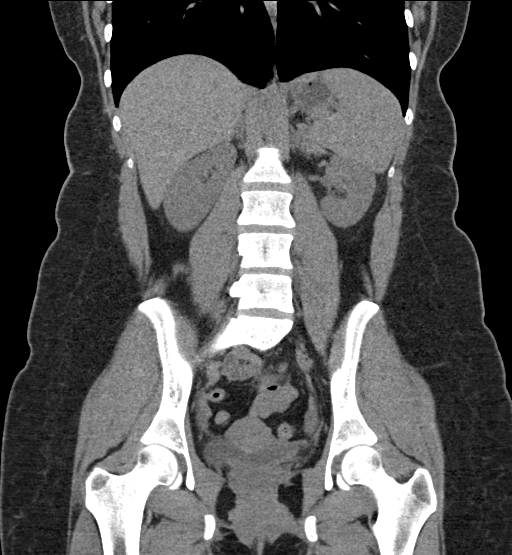

[17 of 46 positions shown; findings below may reference images not displayed]

FINDINGS: Lower chest: No acute abnormality.

Hepatobiliary: No focal liver abnormality is seen. No gallstones,
gallbladder wall thickening, or biliary dilatation.

Pancreas: Unremarkable. No pancreatic ductal dilatation or
surrounding inflammatory changes.

Spleen: Borderline to slightly enlarged, measuring up to 13 cm AP.

Adrenals/Urinary Tract: Adrenal glands are unremarkable. Kidneys are
normal, without renal calculi, focal lesion, or hydronephrosis.
Bladder is unremarkable.

Stomach/Bowel: Stomach is within normal limits. Appendix appears
normal. No evidence of bowel wall thickening, distention, or
inflammatory changes.

Vascular/Lymphatic: No significant vascular findings are present. No
enlarged abdominal or pelvic lymph nodes.

Reproductive: Uterus and bilateral adnexa are unremarkable.

Other: No abdominal wall hernia or abnormality. No abdominopelvic
ascites.

Musculoskeletal: No acute or significant osseous findings.
IMPRESSION: 1. Negative for hydronephrosis or ureteral stone.
2. Borderline to mild splenomegaly.

## 2021-03-31 ENCOUNTER — Ambulatory Visit (INDEPENDENT_AMBULATORY_CARE_PROVIDER_SITE_OTHER): Payer: BC Managed Care – PPO

## 2021-03-31 DIAGNOSIS — J309 Allergic rhinitis, unspecified: Secondary | ICD-10-CM | POA: Diagnosis not present

## 2021-04-21 ENCOUNTER — Ambulatory Visit (INDEPENDENT_AMBULATORY_CARE_PROVIDER_SITE_OTHER): Payer: BC Managed Care – PPO

## 2021-04-21 DIAGNOSIS — J309 Allergic rhinitis, unspecified: Secondary | ICD-10-CM

## 2021-04-28 ENCOUNTER — Ambulatory Visit (INDEPENDENT_AMBULATORY_CARE_PROVIDER_SITE_OTHER): Payer: BC Managed Care – PPO

## 2021-04-28 DIAGNOSIS — J309 Allergic rhinitis, unspecified: Secondary | ICD-10-CM

## 2021-05-05 ENCOUNTER — Ambulatory Visit (INDEPENDENT_AMBULATORY_CARE_PROVIDER_SITE_OTHER): Payer: BC Managed Care – PPO

## 2021-05-05 DIAGNOSIS — J309 Allergic rhinitis, unspecified: Secondary | ICD-10-CM

## 2021-05-18 ENCOUNTER — Ambulatory Visit (INDEPENDENT_AMBULATORY_CARE_PROVIDER_SITE_OTHER): Payer: BC Managed Care – PPO

## 2021-05-18 DIAGNOSIS — J309 Allergic rhinitis, unspecified: Secondary | ICD-10-CM | POA: Diagnosis not present

## 2021-05-24 ENCOUNTER — Ambulatory Visit (INDEPENDENT_AMBULATORY_CARE_PROVIDER_SITE_OTHER): Payer: BC Managed Care – PPO

## 2021-05-24 DIAGNOSIS — J309 Allergic rhinitis, unspecified: Secondary | ICD-10-CM | POA: Diagnosis not present

## 2021-06-02 ENCOUNTER — Ambulatory Visit (INDEPENDENT_AMBULATORY_CARE_PROVIDER_SITE_OTHER): Payer: BC Managed Care – PPO

## 2021-06-02 DIAGNOSIS — J309 Allergic rhinitis, unspecified: Secondary | ICD-10-CM | POA: Diagnosis not present

## 2021-06-08 ENCOUNTER — Other Ambulatory Visit: Payer: Self-pay | Admitting: Family

## 2021-06-21 ENCOUNTER — Ambulatory Visit (INDEPENDENT_AMBULATORY_CARE_PROVIDER_SITE_OTHER): Payer: BC Managed Care – PPO

## 2021-06-21 DIAGNOSIS — J309 Allergic rhinitis, unspecified: Secondary | ICD-10-CM | POA: Diagnosis not present

## 2021-07-21 ENCOUNTER — Ambulatory Visit: Payer: Self-pay

## 2021-08-04 ENCOUNTER — Ambulatory Visit (INDEPENDENT_AMBULATORY_CARE_PROVIDER_SITE_OTHER): Payer: BC Managed Care – PPO

## 2021-08-04 DIAGNOSIS — J309 Allergic rhinitis, unspecified: Secondary | ICD-10-CM | POA: Diagnosis not present

## 2021-08-12 ENCOUNTER — Ambulatory Visit (INDEPENDENT_AMBULATORY_CARE_PROVIDER_SITE_OTHER): Payer: BC Managed Care – PPO

## 2021-08-12 DIAGNOSIS — J309 Allergic rhinitis, unspecified: Secondary | ICD-10-CM

## 2021-08-26 ENCOUNTER — Ambulatory Visit (INDEPENDENT_AMBULATORY_CARE_PROVIDER_SITE_OTHER): Payer: BC Managed Care – PPO

## 2021-08-26 DIAGNOSIS — J309 Allergic rhinitis, unspecified: Secondary | ICD-10-CM

## 2021-08-29 ENCOUNTER — Ambulatory Visit: Payer: BC Managed Care – PPO | Admitting: Family

## 2021-09-03 NOTE — Patient Instructions (Incomplete)
Perennial and seasonal allergic rhinitis Continue Zyrtec 10 mg once a day as needed for runny nose or itching. Continue taking an antihistamine twice a day one day prior to allergy injections, the day of allergy injections, and the day after allergy injections. Caution as this can make you drowsy. Then on the other days go back to Zyrtec 10 mg once a day.  Continue fluticasone nasal spray 1-2 sprays each nostril once a day as needed Continue azelastine nasal spray 1-2 sprays each nostril up to 2 times a day as needed for runny nose or drainage down throat May use saline nasal spray or saline nasal rinse for nasal symptoms. Use this prior to any medicated nasal sprays.  Allergic conjunctivits May use Pataday 0.2% 1 drop each eye once a day as needed for itchy watery eyes  Food allergy/Pollen food allergy syndrome Avoid shellfish,fish, celery, carrot, and cabbage. Also avoid any fresh raw fruits or vegetables which cause mouth or ear itching. In case of an allergic reaction, give Benadryl 4 teaspoonfuls every 4 hours, and if life-threatening symptoms occur, inject with EpiPen 0.3 mg.   Mild intermittent asthma Continue albuterol 2 puffs every 4 hours as needed for cough, wheeze, tightness in chest, or shortness of breath During asthma flares/upper respiratory infections start Flovent 110 mcg 2 puffs twice a day for 1-2 weeks or until symptoms return to baseline. Rinse mouth out afterwards  Hand dermatitis Continue daily moisturizing program Continue triamcinolone twice a day sparingly to red itchy areas. Do not use on face, neck, groin, or armpit region  Please let us know if this treatment plan is not working well for you Schedule a follow up appointment in 3-4 months

## 2021-09-05 ENCOUNTER — Encounter: Payer: Self-pay | Admitting: Family

## 2021-09-05 ENCOUNTER — Ambulatory Visit (INDEPENDENT_AMBULATORY_CARE_PROVIDER_SITE_OTHER): Payer: BC Managed Care – PPO | Admitting: Family

## 2021-09-05 VITALS — BP 124/86 | HR 95 | Temp 97.6°F | Resp 18 | Ht 61.0 in | Wt 179.2 lb

## 2021-09-05 DIAGNOSIS — J4520 Mild intermittent asthma, uncomplicated: Secondary | ICD-10-CM | POA: Diagnosis not present

## 2021-09-05 DIAGNOSIS — J3089 Other allergic rhinitis: Secondary | ICD-10-CM

## 2021-09-05 DIAGNOSIS — J309 Allergic rhinitis, unspecified: Secondary | ICD-10-CM | POA: Diagnosis not present

## 2021-09-05 DIAGNOSIS — H1013 Acute atopic conjunctivitis, bilateral: Secondary | ICD-10-CM

## 2021-09-05 DIAGNOSIS — L309 Dermatitis, unspecified: Secondary | ICD-10-CM | POA: Diagnosis not present

## 2021-09-05 DIAGNOSIS — T781XXD Other adverse food reactions, not elsewhere classified, subsequent encounter: Secondary | ICD-10-CM

## 2021-09-05 DIAGNOSIS — T7800XD Anaphylactic reaction due to unspecified food, subsequent encounter: Secondary | ICD-10-CM

## 2021-09-05 MED ORDER — AZELASTINE HCL 137 MCG/SPRAY NA SOLN: NASAL | 5 refills | Status: DC

## 2021-09-05 MED ORDER — EPINEPHRINE 0.3 MG/0.3ML IJ SOAJ: 0.3000 mg | INTRAMUSCULAR | 1 refills | Status: DC | PRN

## 2021-09-05 MED ORDER — FLUTICASONE PROPIONATE HFA 110 MCG/ACT IN AERO: INHALATION_SPRAY | RESPIRATORY_TRACT | 2 refills | Status: DC

## 2021-09-05 MED ORDER — ALBUTEROL SULFATE HFA 108 (90 BASE) MCG/ACT IN AERS: 2.0000 | INHALATION_SPRAY | RESPIRATORY_TRACT | 1 refills | Status: DC | PRN

## 2021-09-05 NOTE — Progress Notes (Signed)
[address] [place] [address] Dept: [phone]  FOLLOW UP NOTE  Patient ID: Joy Wade, female    DOB: 1990-01-31  Age: 31 y.o. MRN: [id number] Date of Office Visit: 09/05/2021  Assessment  Chief Complaint: Asthma (Worse in July - albuterol use 3 times a week ) and Allergic Rhinitis  (No issues )  HPI Joy Wade is a 31 year old female who presents today for follow-up of mild intermittent asthma, anaphylactic shock due to food, perennial and seasonal allergic rhinitis, allergic conjunctivitis, and pollen food allergy syndrome.  She was last seen on March 15, 2020 by myself.  She reports that she is going to have a GI ultrasound on Wednesday due to a lot of heartburn and nausea that is occurring daily, but symptoms may be worse on some days rather than others.  She wonders if she has a hernia because she reports that her primary care physician felt something on her abdominal exam.  She also mentions that they are watching her thyroid because those levels are elevated.  Mild intermittent asthma: She reports that during the month of July that her breathing was worse.  She was having to use her albuterol at a minimum of 3 times a week.  Now her breathing is better.  She reports occasional dry cough.  She will also have shortness of breath and tightness in her chest and she wonders if this is due to anxiety and or heat.  She will try waiting 30 minutes and if she still having symptoms she use her albuterol inhaler.  When she does use her albuterol inhaler it does help.  She denies wheezing and nocturnal awakenings due to breathing problems.  She has been on 1 round of steroids in April given by urgent care due to asthma exacerbation.  She did use her albuterol inhaler once last week and 2 times during the first week of August.  Discussed trying Singulair for daily controlled medicines, but she is not interested due to the possible side effects.  Hand dermatitis is  reported as doing good.  She uses Eucerin, CeraVe, or Aquaphor for moisturization.  She has not had any skin infections since we last saw her.  She reports that her hands tend to get worse in the winter after washing them.  She will use triamcinolone as needed.  Perennial and seasonal allergic rhinitis: She continues to take Zyrtec 10 mg once a day, allergy injections per protocol, fluticasone 1 to 2 sprays each nostril once a day, and azelastine nasal spray as needed.  She does mention that the month of June she fell off the wagon with getting her allergy injections, but is now back on track.  She reports postnasal drip but as long as she takes her nasal sprays at night this does help decrease the postnasal drip.  She denies rhinorrhea and nasal congestion.  She did have a sinus infection in March.  Allergic conjunctivitis is reported as controlled with Pataday eyedrops as needed.  She denies itchy watery eyes.  She continues to avoid shellfish, fish, carrot, cabbage, and fresh fruit.  She does mention that she can eat celery when it is mixed with other foods without any problems.  She has not had to use her EpiPen since we last saw her.   Drug Allergies:  Allergies  Allergen Reactions   Amoxicillin    Erythromycin Base Dermatitis   Other     amoxicillan   Sulfa Antibiotics    Sulfamethoxazole Dermatitis  Review of Systems: Review of Systems  Constitutional:  Negative for chills and fever.  HENT:         Reports postnasal drip that gets better with nasal spray usage and denies nasal congestion and rhinorrhea  Eyes:        Denies itchy watery eyes  Respiratory:  Positive for cough and shortness of breath. Negative for wheezing.        Reports occasional dry cough and tightness in her chest and shortness of breath that she wonders if it is due to her anxiety.  Denies wheezing and nocturnal awakenings due to breathing problems  Cardiovascular:  Positive for palpitations. Negative for chest  pain.       Reorts palpitations at times that she wonders if due to her anxiety.  She also reports that in September she did have a carotid ultrasound that was normal.  She also notes that her primary care physician is aware of her palpitations at times.  Gastrointestinal:  Positive for heartburn and nausea.       Reports daily heartburn and nausea.  She is going to have a GI ultrasound on Wednesday.  She is wondering if she has a hernia and reports that her primary care physician wants to see what the results are before they put her on anything.  Genitourinary:  Negative for frequency.  Skin:  Positive for rash.       Reports sweats rashes in the bends of arms and knees that get better with shower and lotion  Neurological:  Positive for headaches.       Reports headaches that occur at the back of head. She feels that they are tension headaches  Endo/Heme/Allergies:  Positive for environmental allergies.     Physical Exam: There were no vitals taken for this visit.   Physical Exam Constitutional:      Appearance: Normal appearance.  HENT:     Head: Normocephalic and atraumatic.     Comments: Pharynx normal, eyes normal, ears normal, nose normal    Right Ear: Tympanic membrane, ear canal and external ear normal.     Left Ear: Tympanic membrane, ear canal and external ear normal.     Nose: Nose normal.     Mouth/Throat:     Mouth: Mucous membranes are moist.     Pharynx: Oropharynx is clear.  Eyes:     Conjunctiva/sclera: Conjunctivae normal.  Cardiovascular:     Rate and Rhythm: Regular rhythm.     Heart sounds: Normal heart sounds.  Pulmonary:     Effort: Pulmonary effort is normal.     Breath sounds: Normal breath sounds.     Comments: Lungs clear to auscultation Musculoskeletal:     Cervical back: Neck supple.  Skin:    General: Skin is warm.     Comments: No rashes or urticarial lesions noted  Neurological:     Mental Status: She is alert and oriented to person, place,  and time.  Psychiatric:        Mood and Affect: Mood normal.        Behavior: Behavior normal.        Thought Content: Thought content normal.        Judgment: Judgment normal.     Diagnostics: FVC 3.69 L (108%), FEV1 2.97 L (102%).  Predicted FVC 3.41 L, predicted FEV1 2.90 L.  Spirometry indicates normal respiratory function.  Assessment and Plan: 1. Mild intermittent asthma without complication   2. Perennial and seasonal allergic rhinitis  3. Anaphylactic shock due to food, subsequent encounter   4. Pollen-food allergy, subsequent encounter   5. Allergic conjunctivitis of both eyes   6. Hand dermatitis     Meds ordered this encounter  Medications   albuterol (VENTOLIN HFA) 108 (90 Base) MCG/ACT inhaler    Sig: Inhale 2 puffs into the lungs every 4 (four) hours as needed for wheezing or shortness of breath.    Dispense:  18 g    Refill:  1   Azelastine HCl 137 MCG/SPRAY SOLN    Sig: USE 1-2 SPRAYS IN EACH NOSTRIL ONE TO TWO TIMES A DAY AS NEEDED FOR RUNNY NOSE OR DRAINAGE    Dispense:  30 mL    Refill:  5   fluticasone (FLOVENT HFA) 110 MCG/ACT inhaler    Sig: For asthma flares/upper respiratory infections start 2 puffs twice a day for 1 to 2 weeks or until symptoms return to baseline    Dispense:  1 each    Refill:  2   EPINEPHrine (EPIPEN 2-PAK) 0.3 mg/0.3 mL IJ SOAJ injection    Sig: Inject 0.3 mg into the muscle as needed for anaphylaxis.    Dispense:  2 each    Refill:  1    Patient Instructions  Perennial and seasonal allergic rhinitis Continue Zyrtec 10 mg once a day as needed for runny nose or itching. Continue taking an antihistamine twice a day one day prior to allergy injections, the day of allergy injections, and the day after allergy injections. Caution as this can make you drowsy. Then on the other days go back to Zyrtec 10 mg once a day.  Continue fluticasone nasal spray 1-2 sprays each nostril once a day as needed Continue azelastine nasal spray 1-2  sprays each nostril up to 2 times a day as needed for runny nose or drainage down throat May use saline nasal spray or saline nasal rinse for nasal symptoms. Use this prior to any medicated nasal sprays.  Allergic conjunctivits May use Pataday 0.2% 1 drop each eye once a day as needed for itchy watery eyes  Food allergy/Pollen food allergy syndrome Avoid shellfish,fish, celery, carrot, and cabbage. Also avoid any fresh raw fruits or vegetables which cause mouth or ear itching. In case of an allergic reaction, give Benadryl 4 teaspoonfuls every 4 hours, and if life-threatening symptoms occur, inject with EpiPen 0.3 mg.   Mild intermittent asthma Continue albuterol 2 puffs every 4 hours as needed for cough, wheeze, tightness in chest, or shortness of breath During asthma flares/upper respiratory infections start Flovent 110 mcg 2 puffs twice a day for 1-2 weeks or until symptoms return to baseline. Rinse mouth out afterwards  Hand dermatitis Continue daily moisturizing program Continue triamcinolone twice a day sparingly to red itchy areas. Do not use on face, neck, groin, or armpit region  Please let us know if this treatment plan is not working well for you Schedule a follow up appointment in 3-4 months Return in about 3 months (around 12/06/2021), or if symptoms worsen or fail to improve.    Thank you for the opportunity to care for this patient.  Please do not hesitate to contact me with questions.  Nehemiah Settle, FNP [facility]

## 2021-09-06 NOTE — Progress Notes (Signed)
VIALS EXP 09-07-22 

## 2021-09-16 ENCOUNTER — Ambulatory Visit (INDEPENDENT_AMBULATORY_CARE_PROVIDER_SITE_OTHER): Payer: BC Managed Care – PPO | Admitting: *Deleted

## 2021-09-16 DIAGNOSIS — J309 Allergic rhinitis, unspecified: Secondary | ICD-10-CM | POA: Diagnosis not present

## 2021-11-09 ENCOUNTER — Ambulatory Visit (INDEPENDENT_AMBULATORY_CARE_PROVIDER_SITE_OTHER): Payer: BC Managed Care – PPO | Admitting: *Deleted

## 2021-11-09 DIAGNOSIS — J309 Allergic rhinitis, unspecified: Secondary | ICD-10-CM

## 2021-11-23 ENCOUNTER — Ambulatory Visit (INDEPENDENT_AMBULATORY_CARE_PROVIDER_SITE_OTHER): Payer: BC Managed Care – PPO | Admitting: *Deleted

## 2021-11-23 DIAGNOSIS — J309 Allergic rhinitis, unspecified: Secondary | ICD-10-CM

## 2022-04-13 ENCOUNTER — Telehealth: Payer: Self-pay

## 2022-04-13 MED ORDER — ALBUTEROL SULFATE HFA 108 (90 BASE) MCG/ACT IN AERS: 2.0000 | INHALATION_SPRAY | RESPIRATORY_TRACT | 0 refills | Status: DC | PRN

## 2022-04-13 NOTE — Telephone Encounter (Signed)
Patient called in - DOB/Pharmacy verified - requested medication refill on Albuterol (Ventolin HFA) inhaler.  Patient advised needed to schedule office visit  - last office visit: 09/05/21.  Appt scheduled for : 04/19/22 @ 3:30 pm w/Chrissie in the [place] office.  Patient verbalized understanding, no further questions.  Albuterol (Ventolin HFA) inhaler refill sent to [facility].

## 2022-04-19 ENCOUNTER — Ambulatory Visit: Payer: BC Managed Care – PPO | Admitting: Family

## 2022-04-26 ENCOUNTER — Ambulatory Visit: Payer: BC Managed Care – PPO | Admitting: Family

## 2022-04-26 DIAGNOSIS — J309 Allergic rhinitis, unspecified: Secondary | ICD-10-CM

## 2022-04-28 NOTE — Patient Instructions (Incomplete)
Perennial and seasonal allergic rhinitis Continue Zyrtec 10 mg once a day as needed for runny nose or itching.  Continue fluticasone nasal spray 1-2 sprays each nostril once a day as needed Continue azelastine nasal spray 1-2 sprays each nostril up to 2 times a day as needed for runny nose or drainage down throat May use saline nasal spray or saline nasal rinse for nasal symptoms. Use this prior to any medicated nasal sprays. Schedule an appointment on your way out to restart allergy injections  Allergic conjunctivits May use Pataday 0.2% 1 drop each eye once a day as needed for itchy watery eyes. This is over the counter if your insurance does not cover it. Place Pataday 15-20 minutes before placing contacts  Food allergy/Pollen food allergy syndrome Avoid shellfish,fish, celery, carrot, and cabbage. Also avoid any fresh raw fruits or vegetables which cause mouth or ear itching. In case of an allergic reaction, give Benadryl 4 teaspoonfuls every 4 hours, and if life-threatening symptoms occur, inject with EpiPen 0.3 mg.   Mild intermittent asthma Continue albuterol 2 puffs every 4 hours as needed for cough, wheeze, tightness in chest, or shortness of breath  Start Flovent 110 mcg 2 puffs twice a day with spacer to help prevent cough and wheeze. Rinse mouth out afterwards. Spacer given.  Hand dermatitis Continue daily moisturizing program Continue triamcinolone twice a day sparingly to red itchy areas. Do not use on face, neck, groin, or armpit region  Please let us know if this treatment plan is not working well for you Schedule a follow up appointment in 6-8 weeks or sooner if needed

## 2022-05-01 ENCOUNTER — Telehealth: Payer: Self-pay

## 2022-05-01 ENCOUNTER — Ambulatory Visit: Payer: BC Managed Care – PPO | Admitting: Family

## 2022-05-01 ENCOUNTER — Encounter: Payer: Self-pay | Admitting: Family

## 2022-05-01 ENCOUNTER — Other Ambulatory Visit: Payer: Self-pay | Admitting: Family

## 2022-05-01 ENCOUNTER — Other Ambulatory Visit: Payer: Self-pay

## 2022-05-01 VITALS — BP 110/70 | HR 112 | Temp 98.5°F | Resp 16 | Wt 187.0 lb

## 2022-05-01 DIAGNOSIS — J4520 Mild intermittent asthma, uncomplicated: Secondary | ICD-10-CM

## 2022-05-01 DIAGNOSIS — J3089 Other allergic rhinitis: Secondary | ICD-10-CM

## 2022-05-01 DIAGNOSIS — L309 Dermatitis, unspecified: Secondary | ICD-10-CM | POA: Diagnosis not present

## 2022-05-01 DIAGNOSIS — T781XXD Other adverse food reactions, not elsewhere classified, subsequent encounter: Secondary | ICD-10-CM

## 2022-05-01 DIAGNOSIS — H1013 Acute atopic conjunctivitis, bilateral: Secondary | ICD-10-CM

## 2022-05-01 DIAGNOSIS — T7800XD Anaphylactic reaction due to unspecified food, subsequent encounter: Secondary | ICD-10-CM

## 2022-05-01 MED ORDER — ALBUTEROL SULFATE HFA 108 (90 BASE) MCG/ACT IN AERS: 2.0000 | INHALATION_SPRAY | RESPIRATORY_TRACT | 0 refills | Status: DC | PRN

## 2022-05-01 MED ORDER — OLOPATADINE HCL 0.2 % OP SOLN: OPHTHALMIC | 5 refills | Status: DC

## 2022-05-01 MED ORDER — TRIAMCINOLONE ACETONIDE 0.1 % EX OINT: 1.0000 | TOPICAL_OINTMENT | Freq: Two times a day (BID) | CUTANEOUS | 1 refills | Status: DC

## 2022-05-01 MED ORDER — FLUTICASONE PROPIONATE 50 MCG/ACT NA SUSP: NASAL | 5 refills | Status: DC

## 2022-05-01 MED ORDER — EPINEPHRINE 0.3 MG/0.3ML IJ SOAJ: 0.3000 mg | INTRAMUSCULAR | 1 refills | Status: DC | PRN

## 2022-05-01 MED ORDER — CETIRIZINE HCL 10 MG PO TABS: 10.0000 mg | ORAL_TABLET | Freq: Every day | ORAL | 1 refills | Status: DC

## 2022-05-01 NOTE — Telephone Encounter (Signed)
Insurance will not cover any of the suggested nasal sprays. Per Joy Wade "Unfortunately your insurance doesn't cover any of these medications and you will have to buy them over the counter."   LVM for patient to call the office back if she had any questions about the nasal sprays.

## 2022-05-01 NOTE — Progress Notes (Signed)
[address] [place] [address] Dept: [phone]  FOLLOW UP NOTE  Patient ID: Joy Wade, female    DOB: 11-14-1990  Age: 32 y.o. MRN: [id number] Date of Office Visit: 05/01/2022  Assessment  Chief Complaint: No chief complaint on file.  HPI Joy Wade is a 32 year old female who presents today for follow-up of mild intermittent asthma, perennial and seasonal allergic rhinitis, anaphylactic shock due to food, pollen food allergy, allergic conjunctivitis, and hand dermatitis.  She was last seen by myself on September 05, 2021.  She reports a 15 pound weight gain with increased dose of Wellbutrin.  She reports that she is trying to lose weight.  Seasonal and perennial allergic rhinitis: She reports 3 weeks ago she had nasal congestion and postnasal drip.  She denies rhinorrhea.  She has not had any sinus infections since we last saw her.  She continues to take Zyrtec 10 mg once a day and azelastine nasal spray as needed.  She does not have fluticasone nasal spray.  She feels like her fluticasone nasal spray is out of date.  Her last allergy injection was on November 23, 2021.  She reports she was traveling around that time and forgot about her allergy injections.  She is interested in restarting her allergy injections.  Allergic conjunctivitis: She reports itchy watery eyes.  She does not currently have any eyedrops.  She does occasionally wear contacts, but mostly wears her glasses.  Food allergy/pollen food allergy syndrome: She continues to avoid shellfish, fish, celery, carrot, cabbage, and other raw fruits and vegetables that cause mouth or ear itching.  She reports no accidental ingestion or use of her epinephrine autoinjector device.  Mild intermittent asthma: She reports that in September 2023 she had COVID-19 and was given a prescription for prednisone that she took.  Since then she has had heavy random coughing  with rattling.  She will also have tightness in her  chest sometimes.  She also has shortness of breath that she attributes mostly to anxiety.  She denies wheezing and nocturnal awakenings due to breathing problems.  Since her last office visit she has not made any trips to the emergency room or urgent care due to breathing problems.  She uses her albuterol approximately 2-4 times a week.  When she uses her albuterol it does help.  She feels that spring time is worse for her asthma.  Hand dermatitis: She reports that this is not bad in the spring.  It is worse in the winter.  She has been using Eucerin moisturization.  She does not have triamcinolone to use as needed.  She has not had any skin infections since we last saw her.   Drug Allergies:  Allergies  Allergen Reactions   Amoxicillin    Erythromycin Base Dermatitis   Other     amoxicillan   Sulfamethoxazole Dermatitis   Sulfa Antibiotics Rash    Review of Systems: Review of Systems  Constitutional:  Negative for chills and fever.  HENT:         Reports nasal congestion and postnasal drip 3 weeks ago.  Denies rhinorrhea  Eyes:        Reports itchy eyes  Respiratory:  Positive for cough and shortness of breath. Negative for wheezing.        Reports more coughing since having COVID-19 in September.  She also has tightness in her chest sometimes.  She also has shortness of breath that she feels is mostly anxiety related.  Denies  wheezing and nocturnal awakenings due to breathing problems  Cardiovascular:  Negative for chest pain and palpitations.  Gastrointestinal:        Reports reflux symptoms once or twice a week.  Her symptoms are better if she does not eat late at night.  She mentions that her OB/GYN tried to send in a prescription, but it was not covered.  Skin:        Reports irritated skin on the backs of her knees at times where she sweats and has gained weight.  Denies itchy skin  Neurological:  Positive for headaches.       Reports headaches sometimes that she attributes to  staring at a computer screen too long  Endo/Heme/Allergies:  Positive for environmental allergies.     Physical Exam: BP 110/70   Pulse (!) 112   Temp 98.5 F (36.9 C) (Temporal)   Resp 16   Wt 187 lb (84.8 kg)   SpO2 99%   BMI 35.33 kg/m    Physical Exam Constitutional:      Appearance: Normal appearance.  HENT:     Head: Normocephalic and atraumatic.     Comments: Pharynx normal, eyes normal, ears normal, nose: Bilateral lower turbinates mildly edematous with no drainage noted    Right Ear: Tympanic membrane, ear canal and external ear normal.     Left Ear: Tympanic membrane, ear canal and external ear normal.     Mouth/Throat:     Mouth: Mucous membranes are moist.     Pharynx: Oropharynx is clear.  Eyes:     Conjunctiva/sclera: Conjunctivae normal.  Cardiovascular:     Rate and Rhythm: Regular rhythm. Tachycardia present.     Heart sounds: Normal heart sounds.  Pulmonary:     Effort: Pulmonary effort is normal.     Breath sounds: Normal breath sounds.     Comments: Lungs clear to auscultation Musculoskeletal:     Cervical back: Neck supple.  Skin:    General: Skin is warm.  Neurological:     Mental Status: She is alert and oriented to person, place, and time.  Psychiatric:        Mood and Affect: Mood normal.        Behavior: Behavior normal.        Thought Content: Thought content normal.        Judgment: Judgment normal.     Diagnostics: FVC 4.30 L (126%), FEV1 3.50 L (121%) spirometry indicates normal spirometry.  Assessment and Plan: 1. Mild intermittent asthma without complication   2. Perennial and seasonal allergic rhinitis   3. Anaphylactic shock due to food, subsequent encounter   4. Pollen-food allergy, subsequent encounter   5. Hand dermatitis   6. Allergic conjunctivitis of both eyes     Meds ordered this encounter  Medications   cetirizine (ZYRTEC) 10 MG tablet    Sig: Take 1 tablet (10 mg total) by mouth daily.    Dispense:  90  tablet    Refill:  1   albuterol (VENTOLIN HFA) 108 (90 Base) MCG/ACT inhaler    Sig: Inhale 2 puffs into the lungs every 4 (four) hours as needed for wheezing or shortness of breath.    Dispense:  18 g    Refill:  0   EPINEPHrine (EPIPEN 2-PAK) 0.3 mg/0.3 mL IJ SOAJ injection    Sig: Inject 0.3 mg into the muscle as needed for anaphylaxis.    Dispense:  2 each    Refill:  1  fluticasone (FLONASE) 50 MCG/ACT nasal spray    Sig: Place 2 sprays each nostril once a day as needed for stuffy nose    Dispense:  18.2 g    Refill:  5   Olopatadine HCl 0.2 % SOLN    Sig: Place 1 drop in each eye once a day as needed for itchy watery eyes    Dispense:  2.5 mL    Refill:  5   triamcinolone ointment (KENALOG) 0.1 %    Sig: Apply 1 Application topically 2 (two) times daily. Use 1 application sparingly twice a day as needed to red itchy areas on the hands.  Do not use on face, neck, groin, or armpit region    Dispense:  30 g    Refill:  1    Patient Instructions  Perennial and seasonal allergic rhinitis Continue Zyrtec 10 mg once a day as needed for runny nose or itching.  Continue fluticasone nasal spray 1-2 sprays each nostril once a day as needed Continue azelastine nasal spray 1-2 sprays each nostril up to 2 times a day as needed for runny nose or drainage down throat May use saline nasal spray or saline nasal rinse for nasal symptoms. Use this prior to any medicated nasal sprays. Schedule an appointment on your way out to restart allergy injections  Allergic conjunctivits May use Pataday 0.2% 1 drop each eye once a day as needed for itchy watery eyes. This is over the counter if your insurance does not cover it. Place Pataday 15-20 minutes before placing contacts  Food allergy/Pollen food allergy syndrome Avoid shellfish,fish, celery, carrot, and cabbage. Also avoid any fresh raw fruits or vegetables which cause mouth or ear itching. In case of an allergic reaction, give Benadryl 4  teaspoonfuls every 4 hours, and if life-threatening symptoms occur, inject with EpiPen 0.3 mg.   Mild intermittent asthma Continue albuterol 2 puffs every 4 hours as needed for cough, wheeze, tightness in chest, or shortness of breath  Start Flovent 110 mcg 2 puffs twice a day with spacer to help prevent cough and wheeze. Rinse mouth out afterwards. Spacer given.  Hand dermatitis Continue daily moisturizing program Continue triamcinolone twice a day sparingly to red itchy areas. Do not use on face, neck, groin, or armpit region  Please let us know if this treatment plan is not working well for you Schedule a follow up appointment in 6-8 weeks or sooner if needed  Return in about 6 weeks (around 06/12/2022), or if symptoms worsen or fail to improve.    Thank you for the opportunity to care for this patient.  Please do not hesitate to contact me with questions.  Nehemiah Settle, FNP [facility]

## 2022-05-10 ENCOUNTER — Ambulatory Visit: Payer: BC Managed Care – PPO

## 2022-05-18 ENCOUNTER — Ambulatory Visit (INDEPENDENT_AMBULATORY_CARE_PROVIDER_SITE_OTHER): Payer: BC Managed Care – PPO

## 2022-05-18 DIAGNOSIS — J309 Allergic rhinitis, unspecified: Secondary | ICD-10-CM | POA: Diagnosis not present

## 2022-05-18 NOTE — Progress Notes (Signed)
Immunotherapy   Patient Details  Name: Joy Wade MRN: [id number] Date of Birth: 20-Apr-1990  05/18/2022  Joy Wade started injections for  grasses, weeds, trees, dust mites, molds, cats, dogs, and cockroaches for the second time.  Following schedule: A  Frequency:2 times per week Epi-Pen:Epi-Pen Available  Consent signed and patient instructions given. Patient sat in room twenty seven for thirty minutes without an issue.    Ralene Muskrat 05/18/2022, 4:08 PM

## 2022-05-22 ENCOUNTER — Other Ambulatory Visit: Payer: Self-pay | Admitting: Family

## 2022-05-26 ENCOUNTER — Ambulatory Visit (INDEPENDENT_AMBULATORY_CARE_PROVIDER_SITE_OTHER): Payer: BC Managed Care – PPO | Admitting: *Deleted

## 2022-05-26 DIAGNOSIS — J309 Allergic rhinitis, unspecified: Secondary | ICD-10-CM | POA: Diagnosis not present

## 2022-06-08 ENCOUNTER — Ambulatory Visit (INDEPENDENT_AMBULATORY_CARE_PROVIDER_SITE_OTHER): Payer: BC Managed Care – PPO

## 2022-06-08 DIAGNOSIS — J309 Allergic rhinitis, unspecified: Secondary | ICD-10-CM

## 2022-06-13 ENCOUNTER — Ambulatory Visit: Payer: BC Managed Care – PPO | Admitting: Family

## 2022-06-15 ENCOUNTER — Other Ambulatory Visit: Payer: Self-pay | Admitting: Family

## 2022-06-21 ENCOUNTER — Ambulatory Visit: Payer: BC Managed Care – PPO | Admitting: Family

## 2022-07-11 ENCOUNTER — Ambulatory Visit: Payer: BC Managed Care – PPO | Admitting: Family

## 2022-07-11 DIAGNOSIS — J309 Allergic rhinitis, unspecified: Secondary | ICD-10-CM

## 2022-08-08 ENCOUNTER — Other Ambulatory Visit: Payer: Self-pay | Admitting: *Deleted

## 2022-08-08 ENCOUNTER — Other Ambulatory Visit: Payer: Self-pay | Admitting: Family

## 2022-08-08 ENCOUNTER — Ambulatory Visit (INDEPENDENT_AMBULATORY_CARE_PROVIDER_SITE_OTHER): Payer: BC Managed Care – PPO | Admitting: *Deleted

## 2022-08-08 DIAGNOSIS — J309 Allergic rhinitis, unspecified: Secondary | ICD-10-CM

## 2022-08-08 MED ORDER — FLUTICASONE PROPIONATE HFA 110 MCG/ACT IN AERO: INHALATION_SPRAY | RESPIRATORY_TRACT | 5 refills | Status: DC

## 2022-08-09 ENCOUNTER — Telehealth: Payer: Self-pay | Admitting: *Deleted

## 2022-08-09 ENCOUNTER — Other Ambulatory Visit: Payer: Self-pay | Admitting: Family

## 2022-08-09 NOTE — Telephone Encounter (Signed)
Obtained consent from patient to order next set of allergy vials. I did advise that this would be the last set ordered. Patient verbalized understanding.

## 2022-08-10 DIAGNOSIS — J3089 Other allergic rhinitis: Secondary | ICD-10-CM | POA: Diagnosis not present

## 2022-08-10 NOTE — Telephone Encounter (Signed)
Please change to Qvar Redihaler 80 mcg 2 puffs twice a a day. She does not use a spacer with this medication. Please have the pharmacy give a demonstration on how to use. Rinse mouth out after. Keep her appointment on 08/30/22 with Dr. Allena Katz. Was she able to get Flovent prior to this? I last saw her in April.

## 2022-08-10 NOTE — Progress Notes (Signed)
VIALS EXP 08-10-23

## 2022-08-11 ENCOUNTER — Other Ambulatory Visit: Payer: Self-pay | Admitting: Family

## 2022-08-11 ENCOUNTER — Ambulatory Visit (INDEPENDENT_AMBULATORY_CARE_PROVIDER_SITE_OTHER): Payer: BC Managed Care – PPO | Admitting: *Deleted

## 2022-08-11 ENCOUNTER — Other Ambulatory Visit: Payer: Self-pay | Admitting: *Deleted

## 2022-08-11 DIAGNOSIS — J309 Allergic rhinitis, unspecified: Secondary | ICD-10-CM

## 2022-08-11 MED ORDER — QVAR REDIHALER 80 MCG/ACT IN AERB: 2.0000 | INHALATION_SPRAY | Freq: Two times a day (BID) | RESPIRATORY_TRACT | 2 refills | Status: DC

## 2022-08-11 NOTE — Telephone Encounter (Signed)
New prescription for QVAR has been sent in. Called patient and informed to have pharmacy show her how to use it and keep upcoming appointment. Patient verbalized understanding.

## 2022-08-14 ENCOUNTER — Other Ambulatory Visit: Payer: Self-pay | Admitting: Family

## 2022-08-14 DIAGNOSIS — J3081 Allergic rhinitis due to animal (cat) (dog) hair and dander: Secondary | ICD-10-CM | POA: Diagnosis not present

## 2022-08-22 NOTE — Telephone Encounter (Signed)
I sent in a prescription for Qvar 80 mcg on 08/11/22. Was she able to get this? Is this just an old message?

## 2022-08-25 ENCOUNTER — Ambulatory Visit (INDEPENDENT_AMBULATORY_CARE_PROVIDER_SITE_OTHER): Payer: BC Managed Care – PPO

## 2022-08-25 DIAGNOSIS — J309 Allergic rhinitis, unspecified: Secondary | ICD-10-CM | POA: Diagnosis not present

## 2022-08-28 ENCOUNTER — Ambulatory Visit (INDEPENDENT_AMBULATORY_CARE_PROVIDER_SITE_OTHER): Payer: BC Managed Care – PPO

## 2022-08-28 DIAGNOSIS — J309 Allergic rhinitis, unspecified: Secondary | ICD-10-CM | POA: Diagnosis not present

## 2022-08-30 ENCOUNTER — Encounter: Payer: Self-pay | Admitting: Internal Medicine

## 2022-08-30 ENCOUNTER — Ambulatory Visit: Payer: BC Managed Care – PPO | Admitting: Internal Medicine

## 2022-08-30 ENCOUNTER — Other Ambulatory Visit: Payer: Self-pay

## 2022-08-30 VITALS — BP 128/86 | HR 122 | Temp 98.2°F | Resp 20 | Ht 61.0 in | Wt 190.4 lb

## 2022-08-30 DIAGNOSIS — J4520 Mild intermittent asthma, uncomplicated: Secondary | ICD-10-CM

## 2022-08-30 DIAGNOSIS — L309 Dermatitis, unspecified: Secondary | ICD-10-CM

## 2022-08-30 DIAGNOSIS — J3089 Other allergic rhinitis: Secondary | ICD-10-CM | POA: Diagnosis not present

## 2022-08-30 DIAGNOSIS — T7800XD Anaphylactic reaction due to unspecified food, subsequent encounter: Secondary | ICD-10-CM | POA: Diagnosis not present

## 2022-08-30 MED ORDER — ALBUTEROL SULFATE HFA 108 (90 BASE) MCG/ACT IN AERS: 2.0000 | INHALATION_SPRAY | RESPIRATORY_TRACT | 1 refills | Status: DC | PRN

## 2022-08-30 MED ORDER — QVAR REDIHALER 80 MCG/ACT IN AERB: 2.0000 | INHALATION_SPRAY | Freq: Two times a day (BID) | RESPIRATORY_TRACT | 5 refills | Status: DC

## 2022-08-30 MED ORDER — CETIRIZINE HCL 10 MG PO TABS: 10.0000 mg | ORAL_TABLET | Freq: Every day | ORAL | 1 refills | Status: DC

## 2022-08-30 MED ORDER — AZELASTINE HCL 0.1 % NA SOLN: 2.0000 | Freq: Two times a day (BID) | NASAL | 5 refills | Status: DC | PRN

## 2022-08-30 MED ORDER — TRIAMCINOLONE ACETONIDE 0.1 % EX OINT: TOPICAL_OINTMENT | CUTANEOUS | 1 refills | Status: DC

## 2022-08-30 MED ORDER — EPINEPHRINE 0.3 MG/0.3ML IJ SOAJ: 0.3000 mg | INTRAMUSCULAR | 1 refills | Status: DC | PRN

## 2022-08-30 MED ORDER — OLOPATADINE HCL 0.2 % OP SOLN: OPHTHALMIC | 5 refills | Status: DC

## 2022-08-30 NOTE — Progress Notes (Signed)
FOLLOW UP Date of Service/Encounter:  08/30/22   Subjective:  Joy Wade (DOB: February 05, 1990) is a 32 y.o. female who [facility] for follow up for mild intermittent asthma, allergic rhinoconjunctivitis, food allergies/PFAS, hand dermatitis.   History obtained from: chart review and patient. Last visit with Nehemiah Settle and at the time was having chest tightness and SOB so started her on Flovent/Qvar (based on coverage).  Also concern for anxiety playing a role.    Asthma: Reports doing well since last visit.  No more SOB but still with some chest tightness.  Taking Qvar 2 puffs daily; did not take BID.  She is being seen for anxiety and is on wellbutyrin and thinks that the anxiety is contributing to her symptoms also; she plans to follow up back with them soon due to weight gain with the medicine also.   Albuterol use only 1x/week or less. No ER visits/oral prednisone use.   Allergies: Doing well overall. Sometimes does have itchy ears and throat.  Not much congestion/drainage.  On allergy shots and doing well.  Uses Azelastine and Zyrtec.  Not on Flonase.    Food Allergies: Avoids several raw fruits/veggies Avoids shellfish.  Does not like eating fish, tuna hurts her stomach but no reactions.  Has an Epipen but needs a refill.  Hand Dermatitis: No recent flare ups or use of topical steroids. Usually just a problem in winter.    Past Medical History: Past Medical History:  Diagnosis Date   Angio-edema    Eczema    Mild intermittent asthma 11/16/2016   Urticaria    from medication    Objective:  BP 128/86 (BP Location: Right Arm, Patient Position: Sitting, Cuff Size: Normal)   Pulse (!) 122   Temp 98.2 F (36.8 C) (Temporal)   Resp 20   Ht 5\' 1"  (1.549 m)   Wt 190 lb 6.4 oz (86.4 kg)   SpO2 97%   BMI 35.98 kg/m  Body mass index is 35.98 kg/m. Physical Exam: GEN: alert, well developed HEENT: clear  conjunctiva, TM grey and translucent, nose with mild inferior turbinate hypertrophy, pink nasal mucosa, no rhinorrhea, no cobblestoning HEART: regular rate and rhythm, no murmur LUNGS: clear to auscultation bilaterally, no coughing, unlabored respiration SKIN: no rashes or lesions  Spirometry:  Tracings reviewed. Her effort: Good reproducible efforts. FVC: 4.5L FEV1: 3.58L, 124% predicted FEV1/FVC ratio: 80% Interpretation: Spirometry consistent with normal pattern.  Please see scanned spirometry results for details.   Assessment:   1. Mild intermittent asthma without complication   2. Perennial and seasonal allergic rhinitis   3. Anaphylactic shock due to food, subsequent encounter   4. Hand dermatitis     Plan/Recommendations:  Perennial and seasonal allergic rhinitis -Continue Zyrtec 10 mg once a day as needed for runny nose or itching.   -Continue azelastine nasal spray 1-2 sprays each nostril up to 2 times a day as needed for runny nose or drainage down throat.  -May use saline nasal spray or saline nasal rinse for nasal symptoms. Use this prior to any medicated nasal sprays. - May use Pataday 0.2% 1 drop each eye once a day as needed for itchy watery eyes.  - Continue allergy shots and keep Epipen.    Mild intermittent asthma - Normal spirometry today. I am not convinced the chest tightness is from the asthma since you are otherwise doing well and with normal spirometry; recommend increasing the Qvar to twice daily  temporarily to see if it helps. If not, it might be related to the anxiety.  - Maintenance inhaler: continue Qvar 2 puffs daily. If symptoms worsen, increase 2 puffs twice daily.   - Rescue inhaler: Albuterol 2 puffs via spacer or 1 vial via nebulizer every 4-6 hours as needed for respiratory symptoms of cough, shortness of breath, or wheezing Asthma control goals:  Full participation in all desired activities (may need albuterol before activity) Albuterol  use two times or less a week on average (not counting use with activity) Cough interfering with sleep two times or less a month Oral steroids no more than once a year No hospitalizations  Food allergy/Pollen food allergy syndrome - Avoid shellfish.  - Also avoid any fresh raw fruits or vegetables which cause mouth or ear itching.  - In case of an allergic reaction, give Benadryl 4 teaspoonfuls every 4 hours, and if life-threatening symptoms occur, inject with EpiPen 0.3 mg.  Hand dermatitis - Do a daily soaking tub bath in warm water for 10-15 minutes.  - Use a gentle, unscented cleanser at the end of the bath (such as Dove unscented bar or baby wash, or Aveeno sensitive body wash). Then rinse, pat half-way dry, and apply a gentle, unscented moisturizer cream or ointment (Cerave, Cetaphil, Eucerin, Aveeno)  all over while still damp. Dry skin makes the itching and rash of eczema worse. The skin should be moisturized with a gentle, unscented moisturizer at least twice daily.  - Use only unscented liquid laundry detergent. - Apply prescribed topical steroid (triamcinolone 0.1% below neck) to flared areas (red and thickened eczema) after the moisturizer has soaked into the skin (wait at least 30 minutes). Taper off the topical steroids as the skin improves. Do not use topical steroid for more than 7-10 days at a time.        Return in about 4 months (around 12/30/2022).  Alesia Morin, MD [facility]

## 2022-08-30 NOTE — Patient Instructions (Addendum)
Perennial and seasonal allergic rhinitis -Continue Zyrtec 10 mg once a day as needed for runny nose or itching.   -Continue azelastine nasal spray 1-2 sprays each nostril up to 2 times a day as needed for runny nose or drainage down throat.  -May use saline nasal spray or saline nasal rinse for nasal symptoms. Use this prior to any medicated nasal sprays. - May use Pataday 0.2% 1 drop each eye once a day as needed for itchy watery eyes.  - Continue allergy shots and keep Epipen.    Food allergy/Pollen food allergy syndrome - Avoid shellfish.  - Also avoid any fresh raw fruits or vegetables which cause mouth or ear itching.  - In case of an allergic reaction, give Benadryl 4 teaspoonfuls every 4 hours, and if life-threatening symptoms occur, inject with EpiPen 0.3 mg.  Mild intermittent asthma - Normal spirometry today. I am not convinced the chest tightness is just from the asthma since you are otherwise doing well and with normal spirometry; recommend increasing the Qvar to twice daily temporarily to see if it helps. If not, it might be related to the anxiety.  - Maintenance inhaler: continue Qvar 2 puffs daily. If symptoms worsen, increase 2 puffs twice daily.   - Rescue inhaler: Albuterol 2 puffs via spacer or 1 vial via nebulizer every 4-6 hours as needed for respiratory symptoms of cough, shortness of breath, or wheezing Asthma control goals:  Full participation in all desired activities (may need albuterol before activity) Albuterol use two times or less a week on average (not counting use with activity) Cough interfering with sleep two times or less a month Oral steroids no more than once a year No hospitalizations   Hand dermatitis - Do a daily soaking tub bath in warm water for 10-15 minutes.  - Use a gentle, unscented cleanser at the end of the bath (such as Dove unscented bar or baby wash, or Aveeno sensitive body wash). Then rinse, pat half-way dry, and apply a gentle,  unscented moisturizer cream or ointment (Cerave, Cetaphil, Eucerin, Aveeno)  all over while still damp. Dry skin makes the itching and rash of eczema worse. The skin should be moisturized with a gentle, unscented moisturizer at least twice daily.  - Use only unscented liquid laundry detergent. - Apply prescribed topical steroid (triamcinolone 0.1% below neck) to flared areas (red and thickened eczema) after the moisturizer has soaked into the skin (wait at least 30 minutes). Taper off the topical steroids as the skin improves. Do not use topical steroid for more than 7-10 days at a time.

## 2022-09-05 ENCOUNTER — Ambulatory Visit (INDEPENDENT_AMBULATORY_CARE_PROVIDER_SITE_OTHER): Payer: BC Managed Care – PPO | Admitting: *Deleted

## 2022-09-05 DIAGNOSIS — J309 Allergic rhinitis, unspecified: Secondary | ICD-10-CM

## 2022-09-15 ENCOUNTER — Ambulatory Visit (INDEPENDENT_AMBULATORY_CARE_PROVIDER_SITE_OTHER): Payer: BC Managed Care – PPO | Admitting: *Deleted

## 2022-09-15 DIAGNOSIS — J309 Allergic rhinitis, unspecified: Secondary | ICD-10-CM

## 2022-09-27 ENCOUNTER — Ambulatory Visit (INDEPENDENT_AMBULATORY_CARE_PROVIDER_SITE_OTHER): Payer: BC Managed Care – PPO | Admitting: *Deleted

## 2022-09-27 DIAGNOSIS — J309 Allergic rhinitis, unspecified: Secondary | ICD-10-CM

## 2022-10-03 ENCOUNTER — Encounter: Payer: BC Managed Care – PPO | Admitting: Nurse Practitioner

## 2022-10-11 ENCOUNTER — Ambulatory Visit (INDEPENDENT_AMBULATORY_CARE_PROVIDER_SITE_OTHER): Payer: BC Managed Care – PPO

## 2022-10-11 DIAGNOSIS — J309 Allergic rhinitis, unspecified: Secondary | ICD-10-CM | POA: Diagnosis not present

## 2022-10-25 ENCOUNTER — Ambulatory Visit (INDEPENDENT_AMBULATORY_CARE_PROVIDER_SITE_OTHER): Payer: BC Managed Care – PPO | Admitting: *Deleted

## 2022-10-25 DIAGNOSIS — J309 Allergic rhinitis, unspecified: Secondary | ICD-10-CM

## 2022-11-06 ENCOUNTER — Ambulatory Visit (INDEPENDENT_AMBULATORY_CARE_PROVIDER_SITE_OTHER): Payer: Self-pay | Admitting: *Deleted

## 2022-11-06 DIAGNOSIS — J309 Allergic rhinitis, unspecified: Secondary | ICD-10-CM | POA: Diagnosis not present

## 2022-11-17 ENCOUNTER — Ambulatory Visit (INDEPENDENT_AMBULATORY_CARE_PROVIDER_SITE_OTHER): Payer: BC Managed Care – PPO | Admitting: *Deleted

## 2022-11-17 DIAGNOSIS — J309 Allergic rhinitis, unspecified: Secondary | ICD-10-CM

## 2022-12-05 ENCOUNTER — Telehealth: Payer: Self-pay | Admitting: *Deleted

## 2022-12-05 ENCOUNTER — Ambulatory Visit (INDEPENDENT_AMBULATORY_CARE_PROVIDER_SITE_OTHER): Payer: BC Managed Care – PPO | Admitting: *Deleted

## 2022-12-05 DIAGNOSIS — Z538 Procedure and treatment not carried out for other reasons: Secondary | ICD-10-CM

## 2022-12-05 NOTE — Telephone Encounter (Signed)
Error

## 2022-12-08 ENCOUNTER — Ambulatory Visit (INDEPENDENT_AMBULATORY_CARE_PROVIDER_SITE_OTHER): Payer: BC Managed Care – PPO | Admitting: *Deleted

## 2022-12-08 DIAGNOSIS — J309 Allergic rhinitis, unspecified: Secondary | ICD-10-CM | POA: Diagnosis not present

## 2022-12-22 ENCOUNTER — Ambulatory Visit (INDEPENDENT_AMBULATORY_CARE_PROVIDER_SITE_OTHER): Payer: BC Managed Care – PPO | Admitting: *Deleted

## 2022-12-22 DIAGNOSIS — J309 Allergic rhinitis, unspecified: Secondary | ICD-10-CM

## 2022-12-27 ENCOUNTER — Ambulatory Visit: Payer: Self-pay | Admitting: Internal Medicine

## 2023-02-12 ENCOUNTER — Ambulatory Visit (INDEPENDENT_AMBULATORY_CARE_PROVIDER_SITE_OTHER): Payer: Self-pay | Admitting: *Deleted

## 2023-02-12 DIAGNOSIS — J309 Allergic rhinitis, unspecified: Secondary | ICD-10-CM

## 2023-02-27 ENCOUNTER — Ambulatory Visit: Payer: BC Managed Care – PPO | Admitting: Internal Medicine

## 2023-03-22 ENCOUNTER — Ambulatory Visit (INDEPENDENT_AMBULATORY_CARE_PROVIDER_SITE_OTHER)

## 2023-03-22 DIAGNOSIS — J309 Allergic rhinitis, unspecified: Secondary | ICD-10-CM | POA: Diagnosis not present

## 2023-03-30 ENCOUNTER — Ambulatory Visit (INDEPENDENT_AMBULATORY_CARE_PROVIDER_SITE_OTHER): Payer: Self-pay

## 2023-03-30 DIAGNOSIS — J309 Allergic rhinitis, unspecified: Secondary | ICD-10-CM

## 2023-04-10 ENCOUNTER — Ambulatory Visit: Payer: BC Managed Care – PPO | Admitting: Internal Medicine

## 2023-04-17 ENCOUNTER — Encounter: Payer: Self-pay | Admitting: Internal Medicine

## 2023-04-17 ENCOUNTER — Ambulatory Visit: Admitting: Internal Medicine

## 2023-04-17 ENCOUNTER — Ambulatory Visit (INDEPENDENT_AMBULATORY_CARE_PROVIDER_SITE_OTHER): Payer: Self-pay | Admitting: *Deleted

## 2023-04-17 ENCOUNTER — Other Ambulatory Visit: Payer: Self-pay

## 2023-04-17 VITALS — BP 128/80 | HR 86 | Temp 98.3°F | Resp 18 | Ht 62.0 in | Wt 187.7 lb

## 2023-04-17 DIAGNOSIS — J3089 Other allergic rhinitis: Secondary | ICD-10-CM | POA: Diagnosis not present

## 2023-04-17 DIAGNOSIS — T7802XD Anaphylactic reaction due to shellfish (crustaceans), subsequent encounter: Secondary | ICD-10-CM

## 2023-04-17 DIAGNOSIS — J309 Allergic rhinitis, unspecified: Secondary | ICD-10-CM

## 2023-04-17 DIAGNOSIS — J4520 Mild intermittent asthma, uncomplicated: Secondary | ICD-10-CM

## 2023-04-17 DIAGNOSIS — L309 Dermatitis, unspecified: Secondary | ICD-10-CM

## 2023-04-17 DIAGNOSIS — T781XXD Other adverse food reactions, not elsewhere classified, subsequent encounter: Secondary | ICD-10-CM

## 2023-04-17 MED ORDER — TRIAMCINOLONE ACETONIDE 0.1 % EX OINT: TOPICAL_OINTMENT | CUTANEOUS | 1 refills | Status: AC

## 2023-04-17 MED ORDER — ALBUTEROL SULFATE HFA 108 (90 BASE) MCG/ACT IN AERS: 2.0000 | INHALATION_SPRAY | RESPIRATORY_TRACT | 1 refills | Status: AC | PRN

## 2023-04-17 MED ORDER — LEVOCETIRIZINE DIHYDROCHLORIDE 5 MG PO TABS: 5.0000 mg | ORAL_TABLET | Freq: Every evening | ORAL | 5 refills | Status: DC

## 2023-04-17 MED ORDER — QVAR REDIHALER 80 MCG/ACT IN AERB: 2.0000 | INHALATION_SPRAY | Freq: Two times a day (BID) | RESPIRATORY_TRACT | 5 refills | Status: DC

## 2023-04-17 MED ORDER — AZELASTINE HCL 0.1 % NA SOLN: 2.0000 | Freq: Two times a day (BID) | NASAL | 5 refills | Status: AC | PRN

## 2023-04-17 MED ORDER — OLOPATADINE HCL 0.2 % OP SOLN: OPHTHALMIC | 5 refills | Status: AC

## 2023-04-17 NOTE — Progress Notes (Addendum)
 FOLLOW UP Date of Service/Encounter:  04/17/23   Subjective:  Joy Wade (DOB: 11-15-1990) is a 33 y.o. female who [facility] for follow up for mild intermittent asthma, allergic rhinoconjunctivitis, food allergies/PFAS, hand dermatitis.   History obtained from: chart review and patient. Last visit was with me on 08/30/2022 and at the time, asthma was doing well on Qvar but noted some chest tightness; discussed with normal spirometry and lack of other symptoms, her anxiety was likely the cause for that rather than the asthma.  Allergies doing okay on Azelastine, Zyrtec and AIT.  Avoiding several raw fruits/veggies and shellfish. Hand dermatitis, generally an issue in Winter and responds to topical steroids.   Reports having trouble with her allergies with sniffling, scratchy throat, itchy eyes.  Has been on and off AIT since 2018-2019 and then reinitiated 2021.  Has never built up to red vial.  Reports recently switching care for her mental health and hopes that she will be able to come more regularly and build up because she did note benefit with the shots and wishes to continue at this time.  Using Zyrtec and Azelastine with some improvement.  Asthma has done well overall.  Denies issues with wheezing/dyspnea.  Taking Qvar daily and was even able to discontinue during Winter when her allergies are doing fine.  Rare use of Albuterol. No ER visits/urgent care visits/oral prednisone use since last visit.   Avoids several fruits/veggies and shellfish. No accidental exposures. No interested in shellfish reintroduction.   Hands are doing fine, generally is an issue in winter.  Not moisturizing regularly, sometimes uses topical steroids if they flare.    Past Medical History: Past Medical History:  Diagnosis Date   Angio-edema    Eczema    Mild intermittent asthma 11/16/2016   Urticaria    from medication    Objective:  BP 128/80 (BP  Location: Left Arm, Patient Position: Sitting, Cuff Size: Large)   Pulse 86   Temp 98.3 F (36.8 C) (Temporal)   Resp 18   Ht 5\' 2"  (1.575 m)   Wt 187 lb 11.2 oz (85.1 kg)   SpO2 97%   BMI 34.33 kg/m  Body mass index is 34.33 kg/m. Physical Exam: GEN: alert, well developed HEENT: clear conjunctiva, nose with mild inferior turbinate hypertrophy, pink nasal mucosa, +clear rhinorrhea, + slight cobblestoning HEART: regular rate and rhythm, no murmur LUNGS: clear to auscultation bilaterally, no coughing, unlabored respiration SKIN: no rashes or lesions  Spirometry:  Tracings reviewed. Her effort: Good reproducible efforts. FVC: 4.13L, 117% predicted  FEV1: 3.42L, 115% predicted FEV1/FVC ratio: 83% Interpretation: Spirometry consistent with normal pattern.  Please see scanned spirometry results for details.  Assessment:   1. Hand dermatitis   2. 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 Perennial and seasonal allergic rhinitis   4. Mild intermittent asthma without complication   5. Pollen-food allergy, subsequent encounter     Plan/Recommendations:  Perennial and seasonal allergic rhinitis - Uncontrolled  -May use saline nasal spray or saline nasal rinse for nasal symptoms. Use this prior to any medicated nasal sprays. -Continue Azelastine 2 sprays each nostril up to 2 times a day as needed for runny nose, drainage, congestion, sneezing.  -Can try Xyzal (Levocetrizine) 5mg  or Allegra (Fexofenadine) 180mg  daily.  This replaces Zyrtec.    -May use Pataday (Olopatadine) 0.2% 1 drop each eye once a day as needed for itchy watery eyes.  - Continue allergy  shots and keep Epipen.  Okay to come twice a week but must be 2 days apart.  Initiated AIT 01/2019.  Never reached red vial, discussed considering discontinuation if she is unable to build up as there is not much efficacy.    Mild intermittent asthma - Controlled with normal spirometry today.  MDI technique discussed.   - Maintenance inhaler: continue Qvar 2 puffs daily. If symptoms worsen, increase 2 puffs twice daily.   - Rescue inhaler: Albuterol 2 puffs via spacer or 1 vial via nebulizer every 4-6 hours as needed for respiratory symptoms of cough, shortness of breath, or wheezing Asthma control goals:  Full participation in all desired activities (may need albuterol before activity) Albuterol use two times or less a week on average (not counting use with activity) Cough interfering with sleep two times or less a month Oral steroids no more than once a year No hospitalizations  Food allergy/Pollen food allergy syndrome - Avoid shellfish and any fresh raw fruits or vegetables which cause mouth or ear itching.  - Consider retesting for shellfish with SPT if interested in reintroduction in future.  - Initial rxn: raw fruits/veggies resulting in oral/pharyngeal/ear pruritus. Shellfish resulted in throat and eye itching.  - sIgE 11/2020: negative to shellfish and fish.  - In case of an allergic reaction, give Benadryl 2 teaspoonfuls every 4 hours, and if life-threatening symptoms occur, inject with EpiPen 0.3 mg.  Hand dermatitis - Do a daily soaking tub bath in warm water for 10-15 minutes.  - Use a gentle, unscented cleanser at the end of the bath (such as Dove unscented bar or baby wash, or Aveeno sensitive body wash). Then rinse, pat half-way dry, and apply a gentle, unscented moisturizer cream or ointment (Cerave, Cetaphil, Eucerin, Aveeno)  all over while still damp. Dry skin makes the itching and rash of eczema worse. The skin should be moisturized with a gentle, unscented moisturizer at least twice daily.  - Use only unscented liquid laundry detergent. - Apply prescribed topical steroid (triamcinolone 0.1% below neck) to flared areas (red and thickened eczema) after the moisturizer has soaked into the skin (wait at least 30 minutes). Taper off the topical steroids as the skin improves. Do not use  topical steroid for more than 7-10 days at a time.       Return in about 3 months (around 07/17/2023).  Alesia Morin, MD [facility]

## 2023-04-17 NOTE — Patient Instructions (Addendum)
 Perennial and seasonal allergic rhinitis -May use saline nasal spray or saline nasal rinse for nasal symptoms. Use this prior to any medicated nasal sprays. -Continue Azelastine 2 sprays each nostril up to 2 times a day as needed for runny nose, drainage, congestion, sneezing.  -Can try Xyzal (Levocetrizine) 5mg  or Allegra (Fexofenadine) 180mg  daily.   -May use Pataday (Olopatadine) 0.2% 1 drop each eye once a day as needed for itchy watery eyes.  - Continue allergy shots and keep Epipen.  Okay to come twice a week but must be 2 days apart.    Mild intermittent asthma - Maintenance inhaler: continue Qvar 2 puffs daily. If symptoms worsen, increase 2 puffs twice daily.   - Rescue inhaler: Albuterol 2 puffs via spacer or 1 vial via nebulizer every 4-6 hours as needed for respiratory symptoms of cough, shortness of breath, or wheezing Asthma control goals:  Full participation in all desired activities (may need albuterol before activity) Albuterol use two times or less a week on average (not counting use with activity) Cough interfering with sleep two times or less a month Oral steroids no more than once a year No hospitalizations  Food allergy/Pollen food allergy syndrome - Avoid shellfish.  - Also avoid any fresh raw fruits or vegetables which cause mouth or ear itching.  - In case of an allergic reaction, give Benadryl 2 teaspoonfuls every 4 hours, and if life-threatening symptoms occur, inject with EpiPen 0.3 mg.  Hand dermatitis - Do a daily soaking tub bath in warm water for 10-15 minutes.  - Use a gentle, unscented cleanser at the end of the bath (such as Dove unscented bar or baby wash, or Aveeno sensitive body wash). Then rinse, pat half-way dry, and apply a gentle, unscented moisturizer cream or ointment (Cerave, Cetaphil, Eucerin, Aveeno)  all over while still damp. Dry skin makes the itching and rash of eczema worse. The skin should be moisturized with a gentle, unscented  moisturizer at least twice daily.  - Use only unscented liquid laundry detergent. - Apply prescribed topical steroid (triamcinolone 0.1% below neck) to flared areas (red and thickened eczema) after the moisturizer has soaked into the skin (wait at least 30 minutes). Taper off the topical steroids as the skin improves. Do not use topical steroid for more than 7-10 days at a time.

## 2023-05-15 ENCOUNTER — Ambulatory Visit (INDEPENDENT_AMBULATORY_CARE_PROVIDER_SITE_OTHER): Payer: Self-pay

## 2023-05-15 DIAGNOSIS — J309 Allergic rhinitis, unspecified: Secondary | ICD-10-CM

## 2023-06-15 ENCOUNTER — Ambulatory Visit (INDEPENDENT_AMBULATORY_CARE_PROVIDER_SITE_OTHER): Payer: Self-pay

## 2023-06-15 DIAGNOSIS — J309 Allergic rhinitis, unspecified: Secondary | ICD-10-CM

## 2023-06-20 ENCOUNTER — Ambulatory Visit (INDEPENDENT_AMBULATORY_CARE_PROVIDER_SITE_OTHER): Payer: Self-pay

## 2023-06-20 DIAGNOSIS — J309 Allergic rhinitis, unspecified: Secondary | ICD-10-CM | POA: Diagnosis not present

## 2023-06-29 ENCOUNTER — Ambulatory Visit (INDEPENDENT_AMBULATORY_CARE_PROVIDER_SITE_OTHER): Payer: Self-pay

## 2023-06-29 DIAGNOSIS — J309 Allergic rhinitis, unspecified: Secondary | ICD-10-CM | POA: Diagnosis not present

## 2023-07-04 ENCOUNTER — Ambulatory Visit (INDEPENDENT_AMBULATORY_CARE_PROVIDER_SITE_OTHER): Payer: Self-pay

## 2023-07-04 DIAGNOSIS — J309 Allergic rhinitis, unspecified: Secondary | ICD-10-CM | POA: Diagnosis not present

## 2023-07-11 NOTE — Progress Notes (Unsigned)
 Follow Up Note  RE: Joy Wade MRN: [id number] DOB: 31-Dec-1990 Date of Office Visit: 07/13/2023  Referring provider: Mavis Redge SAILOR, FNP Primary care provider: Mavis Redge SAILOR, FNP  Chief Complaint: Follow-up (Allergies/Asthma /) and Immunotherapy (Renewal )  History of Present Illness: I had the pleasure of seeing Joy Wade for a follow up visit at the [facility] on 07/13/2023. She is a 33 y.o. female, who is being followed for allergic rhinitis on AIT, asthma, food allergy , hand dermatitis. Her previous allergy  office visit was on 04/17/2023 with Dr. Tobie. Today is a new complaint visit of questions regarding AIT.  Discussed the use of AI scribe software for clinical note transcription with the patient, who gave verbal consent to proceed.  History of Present Illness   Her seasonal allergies have been fluctuating, with significant difficulty during the spring due to high pollen counts. She experiences morning throat itchiness, which has improved after switching air purifiers and using an app to monitor air quality and pollen counts. She restarted her allergy  shots but has fallen behind due to personal issues. She takes zyrtec , one tablet the night before, the morning of, and the night after her allergy  injections. She uses azelastine  nasal spray for drainage and regular Visine allergy  eye drops as needed, particularly in spring and fall. She avoids raw fruits, especially strawberries, watermelon, and raspberries, due to throat and mouth irritation, and avoids shellfish due to a confirmed allergy .  Her asthma is managed with Qvar , 80 mcg, two puffs once daily. She uses her rescue inhaler once or twice a week, particularly in the heat, and has not required urgent care since 2024. She is working with a dietitian to manage weight gain, which she notes worsens her asthma.  She has a shellfish allergy  confirmed by blood tests, which causes throat  irritation when shrimp is microwaved. She avoids shellfish and uses international allergy  cards when traveling. Her EpiPen  is up to date until August 2025, and she has never had to use it.        ***  Assessment and Plan: Joy Wade is a 33 y.o. female with: Perennial and seasonal allergic rhinitis - Uncontrolled  -May use saline nasal spray or saline nasal rinse for nasal symptoms. Use this prior to any medicated nasal sprays. -Continue Azelastine  2 sprays each nostril up to 2 times a day as needed for runny nose, drainage, congestion, sneezing.  -Can try Xyzal  (Levocetrizine) 5mg  or Allegra (Fexofenadine) 180mg  daily.  This replaces Zyrtec .    -May use Pataday  (Olopatadine ) 0.2% 1 drop each eye once a day as needed for itchy watery eyes.  - Continue allergy  shots and keep Epipen .  Okay to come twice a week but must be 2 days apart.  Initiated AIT 01/2019.  Never reached red vial, discussed considering discontinuation if she is unable to build up as there is not much efficacy.     Mild intermittent asthma - Controlled with normal spirometry today.  MDI technique discussed.  - Maintenance inhaler: continue Qvar  80mcg 2 puffs daily. If symptoms worsen, increase 2 puffs twice daily.   - Rescue inhaler: Albuterol  2 puffs via spacer or 1 vial via nebulizer every 4-6 hours as needed for respiratory symptoms of cough, shortness of breath, or wheezing Asthma control goals:  Full participation in all desired activities (may need albuterol  before activity) Albuterol  use two times or less a week on average (not counting use with activity) Cough interfering with sleep two times or  less a month Oral steroids no more than once a year No hospitalizations   Food allergy /Pollen food allergy  syndrome - Avoid shellfish and any fresh raw fruits or vegetables which cause mouth or ear itching.  - Consider retesting for shellfish with SPT if interested in reintroduction in future.  - Initial rxn: raw  fruits/veggies resulting in oral/pharyngeal/ear pruritus. Shellfish resulted in throat and eye itching.  - sIgE 11/2020: negative to shellfish and fish.  - In case of an allergic reaction, give Benadryl 2 teaspoonfuls every 4 hours, and if life-threatening symptoms occur, inject with EpiPen  0.3 mg.   Hand dermatitis - Do a daily soaking tub bath in warm water for 10-15 minutes.  - Use a gentle, unscented cleanser at the end of the bath (such as Dove unscented bar or baby wash, or Aveeno sensitive body wash). Then rinse, pat half-way dry, and apply a gentle, unscented moisturizer cream or ointment (Cerave, Cetaphil, Eucerin, Aveeno)  all over while still damp. Dry skin makes the itching and rash of eczema worse. The skin should be moisturized with a gentle, unscented moisturizer at least twice daily.  - Use only unscented liquid laundry detergent. - Apply prescribed topical steroid (triamcinolone  0.1% below neck) to flared areas (red and thickened eczema) after the moisturizer has soaked into the skin (wait at least 30 minutes). Taper off the topical steroids as the skin improves. Do not use topical steroid for more than 7-10 days at a time.  Assessment and Plan    Allergic rhinitis Exacerbated in spring due to high pollen counts. Allergy  shots restarted with previous inconsistent adherence. No significant adverse reactions except localized reactions at higher vial concentrations. - Continue allergy  shots weekly until vials expire. - Refill EpiPen  prescription. - Continue azelastine  nasal spray as needed. - Use Visine allergy  eye drops as needed during spring and fall. - Fill out a new action plan for allergy  management. - Send message to Clarita to mix new vials for continued shots.  Asthma Managed with Qvar  80 mcg daily. Increased rescue inhaler use possibly due to heat and anxiety. No ER visits or urgent care since 2024. Weight gain exacerbates symptoms. - Continue Qvar  80 mcg, two puffs once  daily. - Monitor use of rescue inhaler. - Encourage weight loss to improve asthma control.  Shellfish allergy  Confirmed allergy  with throat irritation upon exposure. Avoidance practiced. Plans to use international allergy  cards for travel. - Continue avoidance of shellfish. - Utilize international allergy  cards for travel.  Obesity Potentially worsens asthma symptoms. Plans to work with a dietitian to lose weight, particularly around the midsection. - Continue working with a dietitian for weight loss.         Return in about 6 months (around 01/12/2024).  Meds ordered this encounter  Medications   beclomethasone (QVAR  REDIHALER) 80 MCG/ACT inhaler    Sig: Inhale 2 puffs into the lungs 2 (two) times daily. Rinse mouth after each use.    Dispense:  1 each    Refill:  5   EPINEPHrine  0.3 mg/0.3 mL IJ SOAJ injection    Sig: Inject 0.3 mg into the muscle as needed for anaphylaxis.    Dispense:  2 each    Refill:  1    May dispense generic/Mylan/Teva brand.   Lab Orders  No laboratory test(s) ordered today    Diagnostics: Spirometry:  Tracings reviewed. Her effort: {Blank single:19197::Good reproducible efforts.,It was hard to get consistent efforts and there is a question as to whether this reflects a  maximal maneuver.,Poor effort, data can not be interpreted.} FVC: ***L FEV1: ***L, ***% predicted FEV1/FVC ratio: ***% Interpretation: {Blank single:19197::Spirometry consistent with mild obstructive disease,Spirometry consistent with moderate obstructive disease,Spirometry consistent with severe obstructive disease,Spirometry consistent with possible restrictive disease,Spirometry consistent with mixed obstructive and restrictive disease,Spirometry uninterpretable due to technique,Spirometry consistent with normal pattern,No overt abnormalities noted given today's efforts}.  Please see scanned spirometry results for details.  Skin Testing: {Blank  single:19197::Select foods,Environmental allergy  panel,Environmental allergy  panel and select foods,Food allergy  panel,None,Deferred due to recent antihistamines use}. *** Results discussed with patient/family.   Medication List:  Current Outpatient Medications  Medication Sig Dispense Refill   EPINEPHrine  0.3 mg/0.3 mL IJ SOAJ injection Inject 0.3 mg into the muscle as needed for anaphylaxis. 2 each 1   losartan (COZAAR) 100 MG tablet Take 100 mg by mouth daily.     albuterol  (VENTOLIN  HFA) 108 (90 Base) MCG/ACT inhaler Inhale 2 puffs into the lungs every 4 (four) hours as needed for wheezing or shortness of breath. 18 g 1   amphetamine-dextroamphetamine (ADDERALL XR) 10 MG 24 hr capsule Take 10 mg by mouth every morning.     azelastine  (ASTELIN ) 0.1 % nasal spray Place 2 sprays into both nostrils 2 (two) times daily as needed for rhinitis. Use in each nostril as directed 30 mL 5   beclomethasone (QVAR  REDIHALER) 80 MCG/ACT inhaler Inhale 2 puffs into the lungs 2 (two) times daily. Rinse mouth after each use. 1 each 5   buPROPion (WELLBUTRIN XL) 150 MG 24 hr tablet Take 150 mg by mouth daily.     buPROPion HCl ER, XL, 450 MG TB24 Take 1 tablet by mouth daily. (Patient not taking: Reported on 04/17/2023)     cetirizine  (ZYRTEC ) 10 MG tablet Take 1 tablet (10 mg total) by mouth daily. (Patient not taking: Reported on 07/13/2023) 90 tablet 1   clonazePAM (KLONOPIN) 0.5 MG tablet Take 0.5 mg by mouth 2 (two) times daily as needed for anxiety. (Patient not taking: Reported on 04/17/2023)     clonazePAM (KLONOPIN) 1 MG tablet Take 0.5-1 mg by mouth 2 (two) times daily as needed.     desvenlafaxine (PRISTIQ) 100 MG 24 hr tablet Take 100 mg by mouth at bedtime.     diphenhydrAMINE (BENADRYL) 12.5 MG/5ML elixir Take by mouth 4 (four) times daily as needed.     fluticasone  (FLONASE ) 50 MCG/ACT nasal spray Place 2 sprays each nostril once a day as needed for stuffy nose (Patient not taking:  Reported on 07/13/2023) 18.2 g 5   levocetirizine (XYZAL ) 5 MG tablet Take 1 tablet (5 mg total) by mouth every evening. 30 tablet 5   losartan (COZAAR) 50 MG tablet Take 50 mg by mouth daily. (Patient not taking: Reported on 07/13/2023)     Olopatadine  HCl 0.2 % SOLN Place 1 drop in each eye once a day as needed for itchy watery eyes 2.5 mL 5   triamcinolone  ointment (KENALOG ) 0.1 % Apply twice daily for flare ups below neck, maximum use 10 days. 30 g 1   No current facility-administered medications for this visit.   Allergies: Allergies  Allergen Reactions   Amoxicillin    Erythromycin Base Dermatitis   Other     amoxicillan   Sulfamethoxazole Dermatitis   Sulfa Antibiotics Rash   I reviewed her past medical history, social history, family history, and environmental history and no significant changes have been reported from her previous visit.  Review of Systems  Constitutional:  Negative for appetite change,  chills, fever and unexpected weight change.  HENT:  Negative for congestion and rhinorrhea.   Eyes:  Negative for itching.  Respiratory:  Negative for cough, chest tightness, shortness of breath and wheezing.   Cardiovascular:  Negative for chest pain.  Gastrointestinal:  Negative for abdominal pain.  Genitourinary:  Negative for difficulty urinating.  Skin:  Negative for rash.  Allergic/Immunologic: Positive for environmental allergies and food allergies.  Neurological:  Negative for headaches.    Objective: BP 110/70   Pulse (!) 105   Temp 98.5 F (36.9 C)   Resp 20   Wt 187 lb (84.8 kg)   SpO2 98%   BMI 34.20 kg/m  Body mass index is 34.2 kg/m. Physical Exam Vitals and nursing note reviewed.  Constitutional:      Appearance: Normal appearance. She is well-developed.  HENT:     Head: Normocephalic and atraumatic.     Right Ear: Tympanic membrane and external ear normal.     Left Ear: Tympanic membrane and external ear normal.     Nose: Nose normal.      Mouth/Throat:     Mouth: Mucous membranes are moist.     Pharynx: Oropharynx is clear.   Eyes:     Conjunctiva/sclera: Conjunctivae normal.    Cardiovascular:     Rate and Rhythm: Normal rate and regular rhythm.     Heart sounds: Normal heart sounds. No murmur heard.    No friction rub. No gallop.  Pulmonary:     Effort: Pulmonary effort is normal.     Breath sounds: Normal breath sounds. No wheezing, rhonchi or rales.   Musculoskeletal:     Cervical back: Neck supple.   Skin:    General: Skin is warm.     Findings: No rash.   Neurological:     Mental Status: She is alert and oriented to person, place, and time.   Psychiatric:        Behavior: Behavior normal.    Previous notes and tests were reviewed. The plan was reviewed with the patient/family, and all questions/concerned were addressed.  It was my pleasure to see Kassandra today and participate in her care. Please feel free to contact me with any questions or concerns.  Sincerely,  Orlan Cramp, DO Allergy  & Immunology  [facility]  [place] office: [phone] [facility] office: [phone]

## 2023-07-13 ENCOUNTER — Other Ambulatory Visit: Payer: Self-pay

## 2023-07-13 ENCOUNTER — Ambulatory Visit: Admitting: Allergy

## 2023-07-13 ENCOUNTER — Encounter: Payer: Self-pay | Admitting: Allergy

## 2023-07-13 VITALS — BP 110/70 | HR 105 | Temp 98.5°F | Resp 20 | Wt 187.0 lb

## 2023-07-13 DIAGNOSIS — T781XXD Other adverse food reactions, not elsewhere classified, subsequent encounter: Secondary | ICD-10-CM | POA: Diagnosis not present

## 2023-07-13 DIAGNOSIS — J3089 Other allergic rhinitis: Secondary | ICD-10-CM

## 2023-07-13 DIAGNOSIS — L309 Dermatitis, unspecified: Secondary | ICD-10-CM | POA: Diagnosis not present

## 2023-07-13 DIAGNOSIS — J309 Allergic rhinitis, unspecified: Secondary | ICD-10-CM

## 2023-07-13 DIAGNOSIS — J4520 Mild intermittent asthma, uncomplicated: Secondary | ICD-10-CM | POA: Diagnosis not present

## 2023-07-13 DIAGNOSIS — T7800XD Anaphylactic reaction due to unspecified food, subsequent encounter: Secondary | ICD-10-CM

## 2023-07-13 MED ORDER — QVAR REDIHALER 80 MCG/ACT IN AERB: 2.0000 | INHALATION_SPRAY | Freq: Two times a day (BID) | RESPIRATORY_TRACT | 5 refills | Status: AC

## 2023-07-13 MED ORDER — EPINEPHRINE 0.3 MG/0.3ML IJ SOAJ: 0.3000 mg | INTRAMUSCULAR | 1 refills | Status: AC | PRN

## 2023-07-13 NOTE — Patient Instructions (Addendum)
 Allergic rhinitis Continue environmental control measures.  Continue allergy  injections.  Will mix new vials as current ones will expire in July. Use over the counter antihistamines such as Zyrtec  (cetirizine ), Claritin (loratadine), Allegra (fexofenadine), or Xyzal  (levocetirizine) daily as needed. May take twice a day during allergy  flares. May switch antihistamines every few months. Use azelastine  nasal spray 1-2 sprays per nostril twice a day as needed for runny nose/drainage. Nasal saline spray (i.e., Simply Saline) or nasal saline lavage (i.e., NeilMed) is recommended as needed and prior to medicated nasal sprays.  Asthma Daily controller medication(s): continue Qvar  80mcg 2 puffs once daily and rinse mouth after each use. During respiratory infections/flares:  Increase Qvar  80mcg 2 puffs twice a day for 1-2 weeks until your breathing symptoms return to baseline.  Pretreat with albuterol  2 puffs. May use albuterol  rescue inhaler 2 puffs every 4 to 6 hours as needed for shortness of breath, chest tightness, coughing, and wheezing. May use albuterol  rescue inhaler 2 puffs 5 to 15 minutes prior to strenuous physical activities. Monitor frequency of use - if you need to use it more than twice per week on a consistent basis let us  know.  Breathing control goals:  Full participation in all desired activities (may need albuterol  before activity) Albuterol  use two times or less a week on average (not counting use with activity) Cough interfering with sleep two times or less a month Oral steroids no more than once a year No hospitalizations   Food allergies Start strict avoidance of shellfish, certain raw fruits/vegetables.  I have prescribed epinephrine  injectable device. For mild symptoms you can take over the counter antihistamines (zyrtec  10mg  to 20mg ) and monitor symptoms closely.  If symptoms worsen or if you have severe symptoms including breathing issues, throat closure, significant  swelling, whole body hives, severe diarrhea and vomiting, lightheadedness then use epinephrine  and seek immediate medical care afterwards. Emergency action plan given.  Return in about 6 months (around 01/12/2024).with Dr. Tobie Or sooner if needed.    Reducing Pollen Exposure Pollen seasons: trees (spring), grass (summer) and ragweed/weeds (fall). Keep windows closed in your home and car to lower pollen exposure.  Install air conditioning in the bedroom and throughout the house if possible.  Avoid going out in dry windy days - especially early morning. Pollen counts are highest between 5 - 10 AM and on dry, hot and windy days.  Save outside activities for late afternoon or after a heavy rain, when pollen levels are lower.  Avoid mowing of grass if you have grass pollen allergy . Be aware that pollen can also be transported indoors on people and pets.  Dry your clothes in an automatic dryer rather than hanging them outside where they might collect pollen.  Rinse hair and eyes before bedtime. Mold Control Mold and fungi can grow on a variety of surfaces provided certain temperature and moisture conditions exist.  Outdoor molds grow on plants, decaying vegetation and soil. The major outdoor mold, Alternaria and Cladosporium, are found in very high numbers during hot and dry conditions. Generally, a late summer - fall peak is seen for common outdoor fungal spores. Rain will temporarily lower outdoor mold spore count, but counts rise rapidly when the rainy period ends. The most important indoor molds are Aspergillus and Penicillium. Dark, humid and poorly ventilated basements are ideal sites for mold growth. The next most common sites of mold growth are the bathroom and the kitchen. Outdoor (Seasonal) Mold Control Use air conditioning and keep windows closed.  Avoid exposure to decaying vegetation. Avoid leaf raking. Avoid grain handling. Consider wearing a face mask if working in moldy areas.   Indoor (Perennial) Mold Control  Maintain humidity below 50%. Get rid of mold growth on hard surfaces with water, detergent and, if necessary, 5% bleach (do not mix with other cleaners). Then dry the area completely. If mold covers an area more than 10 square feet, consider hiring an indoor environmental professional. For clothing, washing with soap and water is best. If moldy items cannot be cleaned and dried, throw them away. Remove sources e.g. contaminated carpets. Repair and seal leaking roofs or pipes. Using dehumidifiers in damp basements may be helpful, but empty the water and clean units regularly to prevent mildew from forming. All rooms, especially basements, bathrooms and kitchens, require ventilation and cleaning to deter mold and mildew growth. Avoid carpeting on concrete or damp floors, and storing items in damp areas. Control of House Dust Mite Allergen Dust mite allergens are a common trigger of allergy  and asthma symptoms. While they can be found throughout the house, these microscopic creatures thrive in warm, humid environments such as bedding, upholstered furniture and carpeting. Because so much time is spent in the bedroom, it is essential to reduce mite levels there.  Encase pillows, mattresses, and box springs in special allergen-proof fabric covers or airtight, zippered plastic covers.  Bedding should be washed weekly in hot water (130 F) and dried in a hot dryer. Allergen-proof covers are available for comforters and pillows that can't be regularly washed.  Wash the allergy -proof covers every few months. Minimize clutter in the bedroom. Keep pets out of the bedroom.  Keep humidity less than 50% by using a dehumidifier or air conditioning. You can buy a humidity measuring device called a hygrometer to monitor this.  If possible, replace carpets with hardwood, linoleum, or washable area rugs. If that's not possible, vacuum frequently with a vacuum that has a HEPA  filter. Remove all upholstered furniture and non-washable window drapes from the bedroom. Remove all non-washable stuffed toys from the bedroom.  Wash stuffed toys weekly. Pet Allergen Avoidance: Contrary to popular opinion, there are no "hypoallergenic" breeds of dogs or cats. That is because people are not allergic to an animal's hair, but to an allergen found in the animal's saliva, dander (dead skin flakes) or urine. Pet allergy  symptoms typically occur within minutes. For some people, symptoms can build up and become most severe 8 to 12 hours after contact with the animal. People with severe allergies can experience reactions in public places if dander has been transported on the pet owners' clothing. Keeping an animal outdoors is only a partial solution, since homes with pets in the yard still have higher concentrations of animal allergens. Before getting a pet, ask your allergist to determine if you are allergic to animals. If your pet is already considered part of your family, try to minimize contact and keep the pet out of the bedroom and other rooms where you spend a great deal of time. As with dust mites, vacuum carpets often or replace carpet with a hardwood floor, tile or linoleum. High-efficiency particulate air (HEPA) cleaners can reduce allergen levels over time. While dander and saliva are the source of cat and dog allergens, urine is the source of allergens from rabbits, hamsters, mice and [place] pigs; so ask a non-allergic family member to clean the animal's cage. If you have a pet allergy , talk to your allergist about the potential for allergy  immunotherapy (allergy   shots). This strategy can often provide long-term relief. Cockroach Allergen Avoidance Cockroaches are often found in the homes of densely populated urban areas, schools or commercial buildings, but these creatures can lurk almost anywhere. This does not mean that you have a dirty house or living area. Block all areas  where roaches can enter the home. This includes crevices, wall cracks and windows.  Cockroaches need water to survive, so fix and seal all leaky faucets and pipes. Have an exterminator go through the house when your family and pets are gone to eliminate any remaining roaches. Keep food in lidded containers and put pet food dishes away after your pets are done eating. Vacuum and sweep the floor after meals, and take out garbage and recyclables. Use lidded garbage containers in the kitchen. Wash dishes immediately after use and clean under stoves, refrigerators or toasters where crumbs can accumulate. Wipe off the stove and other kitchen surfaces and cupboards regularly.

## 2023-07-15 ENCOUNTER — Encounter: Payer: Self-pay | Admitting: Allergy

## 2023-07-16 DIAGNOSIS — J3089 Other allergic rhinitis: Secondary | ICD-10-CM | POA: Diagnosis not present

## 2023-07-16 NOTE — Progress Notes (Signed)
 VIALS MADE 07-16-23

## 2023-07-17 ENCOUNTER — Ambulatory Visit (INDEPENDENT_AMBULATORY_CARE_PROVIDER_SITE_OTHER)

## 2023-07-17 DIAGNOSIS — J3081 Allergic rhinitis due to animal (cat) (dog) hair and dander: Secondary | ICD-10-CM | POA: Diagnosis not present

## 2023-07-17 DIAGNOSIS — J309 Allergic rhinitis, unspecified: Secondary | ICD-10-CM | POA: Diagnosis not present

## 2023-07-24 ENCOUNTER — Ambulatory Visit (INDEPENDENT_AMBULATORY_CARE_PROVIDER_SITE_OTHER)

## 2023-07-24 DIAGNOSIS — J309 Allergic rhinitis, unspecified: Secondary | ICD-10-CM

## 2023-08-01 ENCOUNTER — Ambulatory Visit (INDEPENDENT_AMBULATORY_CARE_PROVIDER_SITE_OTHER)

## 2023-08-01 DIAGNOSIS — J309 Allergic rhinitis, unspecified: Secondary | ICD-10-CM

## 2023-08-03 ENCOUNTER — Ambulatory Visit

## 2023-08-03 DIAGNOSIS — J309 Allergic rhinitis, unspecified: Secondary | ICD-10-CM | POA: Diagnosis not present

## 2023-08-10 ENCOUNTER — Ambulatory Visit

## 2023-08-10 DIAGNOSIS — J309 Allergic rhinitis, unspecified: Secondary | ICD-10-CM

## 2023-08-31 ENCOUNTER — Ambulatory Visit (INDEPENDENT_AMBULATORY_CARE_PROVIDER_SITE_OTHER): Admitting: *Deleted

## 2023-08-31 DIAGNOSIS — J309 Allergic rhinitis, unspecified: Secondary | ICD-10-CM | POA: Diagnosis not present

## 2023-09-07 ENCOUNTER — Ambulatory Visit (INDEPENDENT_AMBULATORY_CARE_PROVIDER_SITE_OTHER)

## 2023-09-07 DIAGNOSIS — J309 Allergic rhinitis, unspecified: Secondary | ICD-10-CM | POA: Diagnosis not present

## 2023-09-19 ENCOUNTER — Ambulatory Visit (INDEPENDENT_AMBULATORY_CARE_PROVIDER_SITE_OTHER)

## 2023-09-19 DIAGNOSIS — J309 Allergic rhinitis, unspecified: Secondary | ICD-10-CM

## 2023-09-24 ENCOUNTER — Ambulatory Visit (INDEPENDENT_AMBULATORY_CARE_PROVIDER_SITE_OTHER)

## 2023-09-24 DIAGNOSIS — J309 Allergic rhinitis, unspecified: Secondary | ICD-10-CM

## 2023-10-05 ENCOUNTER — Ambulatory Visit (INDEPENDENT_AMBULATORY_CARE_PROVIDER_SITE_OTHER): Admitting: *Deleted

## 2023-10-05 DIAGNOSIS — J309 Allergic rhinitis, unspecified: Secondary | ICD-10-CM | POA: Diagnosis not present

## 2023-10-11 ENCOUNTER — Ambulatory Visit (INDEPENDENT_AMBULATORY_CARE_PROVIDER_SITE_OTHER)

## 2023-10-11 DIAGNOSIS — J309 Allergic rhinitis, unspecified: Secondary | ICD-10-CM

## 2023-10-24 ENCOUNTER — Ambulatory Visit

## 2023-10-24 DIAGNOSIS — J309 Allergic rhinitis, unspecified: Secondary | ICD-10-CM | POA: Diagnosis not present

## 2023-11-09 ENCOUNTER — Ambulatory Visit

## 2023-11-09 DIAGNOSIS — J309 Allergic rhinitis, unspecified: Secondary | ICD-10-CM

## 2023-11-27 ENCOUNTER — Ambulatory Visit

## 2023-11-27 DIAGNOSIS — J309 Allergic rhinitis, unspecified: Secondary | ICD-10-CM

## 2023-12-18 ENCOUNTER — Other Ambulatory Visit: Payer: Self-pay | Admitting: Internal Medicine

## 2023-12-18 ENCOUNTER — Ambulatory Visit

## 2023-12-18 DIAGNOSIS — J309 Allergic rhinitis, unspecified: Secondary | ICD-10-CM

## 2024-01-01 ENCOUNTER — Ambulatory Visit

## 2024-01-01 DIAGNOSIS — J309 Allergic rhinitis, unspecified: Secondary | ICD-10-CM

## 2024-01-08 ENCOUNTER — Ambulatory Visit: Admitting: Internal Medicine

## 2024-02-18 ENCOUNTER — Ambulatory Visit: Admitting: Family

## 2024-02-26 ENCOUNTER — Ambulatory Visit: Admitting: Family
